# Patient Record
Sex: Female | Born: 1985 | Race: Black or African American | Hispanic: No | Marital: Single | Smoking: Never smoker
Health system: Southern US, Community
[De-identification: ages and names within clinical notes are randomized; demographics above are authoritative.]

## PROBLEM LIST (undated history)

## (undated) HISTORY — PX: NO PAST SURGERIES: SHX2092

---

## 2004-06-02 ENCOUNTER — Other Ambulatory Visit: Admission: RE | Admit: 2004-06-02 | Discharge: 2004-06-02 | Payer: Self-pay | Admitting: Obstetrics and Gynecology

## 2004-06-05 ENCOUNTER — Other Ambulatory Visit: Admission: RE | Admit: 2004-06-05 | Discharge: 2004-06-05 | Payer: Self-pay | Admitting: Obstetrics and Gynecology

## 2006-07-04 ENCOUNTER — Observation Stay (HOSPITAL_COMMUNITY): Admission: EM | Admit: 2006-07-04 | Discharge: 2006-07-06 | Payer: Self-pay | Admitting: Emergency Medicine

## 2006-07-31 ENCOUNTER — Emergency Department (HOSPITAL_COMMUNITY): Admission: EM | Admit: 2006-07-31 | Discharge: 2006-07-31 | Payer: Self-pay | Admitting: Family Medicine

## 2010-03-07 ENCOUNTER — Emergency Department (HOSPITAL_COMMUNITY): Admission: EM | Admit: 2010-03-07 | Discharge: 2010-03-07 | Payer: Self-pay | Admitting: Family Medicine

## 2011-01-31 ENCOUNTER — Inpatient Hospital Stay (HOSPITAL_COMMUNITY): Admission: AD | Admit: 2011-01-31 | Payer: Self-pay | Source: Home / Self Care | Admitting: Obstetrics and Gynecology

## 2011-02-02 ENCOUNTER — Inpatient Hospital Stay (HOSPITAL_COMMUNITY)
Admission: AD | Admit: 2011-02-02 | Discharge: 2011-02-04 | DRG: 775 | Disposition: A | Discharge status: Home / Self Care | Payer: PRIVATE HEALTH INSURANCE | Source: Ambulatory Visit | Attending: Obstetrics and Gynecology | Admitting: Obstetrics and Gynecology

## 2011-02-02 LAB — CBC
HCT: 33.9 % — ABNORMAL LOW (ref 36.0–46.0)
Hemoglobin: 10.8 g/dL — ABNORMAL LOW (ref 12.0–15.0)
MCH: 27.3 pg (ref 26.0–34.0)
MCHC: 31.9 g/dL (ref 30.0–36.0)
MCV: 85.6 fL (ref 78.0–100.0)
RDW: 12.5 % (ref 11.5–15.5)

## 2011-03-30 NOTE — H&P (Signed)
[name], [name]                ACCOUNT NO.:  [id number]   MEDICAL RECORD NO.:  [id number]          PATIENT TYPE:  EMS   LOCATION:  ED                           FACILITY:  [facility]   PHYSICIAN:  Hillery Aldo, M.D.   DATE OF BIRTH:  01/13/1986   DATE OF ADMISSION:  07/04/2006  DATE OF DISCHARGE:                                HISTORY & PHYSICAL   PRIMARY CARE PHYSICIAN:  Unassigned.   CHIEF COMPLAINT:  Sore throat.   HISTORY OF PRESENT ILLNESS:  The patient is a 25 year old female who was  sent to [facility] emergency department from [facility] secondary to  concerns that the patient might have a peritonsillar abscess.  The patient  complains of a 3-day history of worsening sore throat.  Denies any fever.  She was given IV fluids in the emergency department and was going to be  discharged home when she developed nausea and vomiting as well as watery  diarrhea.  Because of concerns of dehydration, she is being admitted for 23-  hour observation.   PAST MEDICAL HISTORY:  None.   FAMILY HISTORY:  Noncontributory.   SOCIAL HISTORY:  The patient is single.  She lives with her mother.  She is  a nonsmoker.  No alcohol or drug use.   ALLERGIES:  None.   MEDICATIONS:  None.   REVIEW OF SYSTEMS:  The patient has had a diminished appetite secondary to  throat pain.  Denies any shortness of breath or cough.  No melena or  hematochezia.  No abdominal cramping.  Prior to this illness, she had no  decrease in her energy level.  No skin rashes.  No joint pains.  No dysuria.  The patient's mother had a sore throat approximately 2 weeks ago, but no  other sick contacts.  The patient states that her menses are regular and  that she was HIV tested approximately 2 years ago.  She has had some  malodorous vaginal discharge.   PHYSICAL EXAMINATION:  VITAL SIGNS:  Temperature 100, pulse 124,  respirations 20, blood pressure 85/51, O2 saturation 97% on room air.  GENERAL:  Lethargic,  ill-appearing female who appears to be her stated age.  HEENT:  Normocephalic, atraumatic.  Pupils are equal, round and reactive to  light.  Extraocular movements intact.  Oropharynx reveals a diffusely  swollen posterior pharynx with exudative tonsillitis.  NECK:  Supple.  There is anterior cervical lymphadenopathy.  CHEST:  Lungs clear to auscultation bilaterally with good air movement.  HEART:  Tachycardiac rate, regular rhythm.  No murmurs, rubs, gallops.  ABDOMEN:  Soft, nontender, nondistended with normoactive bowel sounds.  EXTREMITIES:  No clubbing, edema, or cyanosis.  SKIN:  Warm and dry.  No rashes.  NEUROLOGIC:  The patient is alert and oriented x3.  Cranial nerves II-XII  are grossly intact.  Nonfocal.   DATA REVIEW:  Mono screen is negative.  White blood cell count is 1.8,  hemoglobin 12.9, hematocrit 38.7, platelets 151 with an absolute neutrophil  count of 1.7.  There is lymphopenia and monocytopenia.  Sodium is 136,  potassium  4.0, chloride 103, bicarbonate 23, BUN 16, creatinine 1.2, glucose  85.   ASSESSMENT/PLAN:  1. Exudative tonsillitis:  The patient has a severe exudative tonsillitis.      No obvious evidence of a peritonsillar abscess.  Nevertheless, Dr.      Annalee Genta has been consulted and plans to see the patient in the      morning.  We will admit her for 23-hour observation, and given the      severity of her tonsillitis, empirically treat her with azithromycin.      We will check strep and throat cultures.  Given her age and associated      lymphocytopenia, I will check an human immunodeficiency virus antibody      and P23 antigen.  2. Lymphopenia/leukopenia:  The patient's differential does show marked      lymphopenia.  The differential for this is broad but common causes are      severe infection, human immunodeficiency virus infection, and systemic      lupus erythematosus.  The patient does not endorse any symptoms of      systemic lupus  erythematosus.  This is likely secondary to her severe      illness.  We will check an human immunodeficiency virus antibody and      P23 antigen as outlined under problem #1.  Will monitor her blood      counts closely.  3. Dehydration:  The patient has an elevated BUN to creatinine ratio      suggestive of dehydration.  We will give her IV fluids and monitor her      renal function.  Will treat her nausea with antiemetics.  4. Prophylaxis:  Will place the patient on a proton pump inhibitor;      however, she is ambulatory and does not need deep vein thrombosis      prophylaxis.      Hillery Aldo, M.D.  Electronically Signed     CR/MEDQ  D:  07/04/2006  T:  07/04/2006  Job:  [id number]

## 2011-03-30 NOTE — Discharge Summary (Signed)
[name], [name]                ACCOUNT NO.:  [id number]   MEDICAL RECORD NO.:  [id number]          PATIENT TYPE:  INP   LOCATION:  1332                         FACILITY:  [facility]   PHYSICIAN:  Isidor Holts, M.D.  DATE OF BIRTH:  April 19, 1986   DATE OF ADMISSION:  07/03/2006  DATE OF DISCHARGE:  07/06/2006                                 DISCHARGE SUMMARY   DISCHARGE DIAGNOSIS:  1. Acute exudative tonsillitis.  2. Acute gastroenteritis.  3. Dehydration.   MEDICATIONS:  1. Azithromycin 250 mg p.o. daily for 5 days.  2. Tylenol Extra Strength 1 g p.o. p.r.n. q. 6 h. for five days only.   PROCEDURES:  None.   CONSULTATIONS:  None.   HISTORY OF PRESENT ILLNESS:  As in H&P notes of July 04, 2006, dictated by  Dr. Hillery Aldo. However, in brief this is a 25 year old female, with no  significant past medical history, who presents from [facility], with a 3 day  history of worsening sore throat, associated with vomiting and diarrhea on  the day of presentation. Physical examination revealed severe exudative  tonsillitis.  She was found to have a blood pressure of 85/51 with a pulse  rate of 124.  Electrolytes revealed a BUN of 16 with a creatinine of 1.2.  The patient was admitted for further evaluation, investigation and  management.   HOSPITAL COURSE:  PROBLEM #1 - ACUTE EXUDATIVE TONSILLITIS:  Initial concern  was of possible tonsillar abscess, however, this was not borne out by  physical examination.  The patient had a number of tests, including monospot  test, rapid strep test and HIV test, all of which were negative.  She was  treated with intravenous azithromycin with satisfactory clinical response.  By July 05, 2006, the patient was already eating quite adequately and by  July 06, 2006, was noted to be asymptomatic, inflammatory phenomena in the  tonsils had markedly subsided, and the patient is in no discomfort  whatsoever.   PROBLEM #2 - ACUTE GASTROENTERITIS:   The patient presented with a one day  history of vomiting and diarrhea of about 6 stools per day.  Stool tests  revealed no evidence of C. difficile toxin.  She was managed with  intravenous fluids, anti-emetics, proton pump inhibitor and p.r.n.  loperamide for symptomatic relief with prompt amelioration of symptoms.  By  July 05, 2006, the diarrhea had resolved, and there was no more vomiting.  By July 06, 2006, the patient was totally asymptomatic.   PROBLEM #3 - DEHYDRATION:  This occurred against a background of poor oral  intake, as well as gastroenteritis.  This responded to intravenous fluids,  and had resolved by July 05, 2006.   DISPOSITION:  The patient was considered sufficiently recovered and  clinicallly stable to be discharged on July 06, 2006.  It is pertinent to  note that at the time of initial presentation the patient's CBC showed a WBC  of 1.8, hemoglobin of 12.9, hematocrit 38.4, platelets 151,000.  ANC was  1.7, raising the specter of possible neutropenia.  However, repeat CBC later  the  same day revealed a WBC of 19.1.  By July 06, 2006, the WBC had  normalized at 9.6. It appears that initial WBC findings were likely a  laboratory error.  The patient was reassured accordingly.   DIET:  No restrictions.   ACTIVITY:  As tolerated.   WOUND CARE:  Not applicable.   PAIN MANAGEMENT:  Not applicable.   FOLLOWUP INSTRUCTIONS:  The patient has been supplied with telephone number  for [facility].  She has been strongly urged to call, to  establish a primary M.D. for routine and preventative care.  She is  recommended to return to regular duties on July 08, 2006.      Isidor Holts, M.D.  Electronically Signed     CO/MEDQ  D:  07/06/2006  T:  07/06/2006  Job:  [id number]

## 2017-03-26 ENCOUNTER — Other Ambulatory Visit (HOSPITAL_COMMUNITY): Payer: Self-pay | Admitting: Internal Medicine

## 2017-03-26 ENCOUNTER — Ambulatory Visit (HOSPITAL_COMMUNITY)
Admission: RE | Admit: 2017-03-26 | Discharge: 2017-03-26 | Disposition: A | Discharge status: Home / Self Care | Payer: Medicaid Other | Source: Ambulatory Visit | Attending: Internal Medicine | Admitting: Internal Medicine

## 2017-03-26 DIAGNOSIS — R0989 Other specified symptoms and signs involving the circulatory and respiratory systems: Secondary | ICD-10-CM | POA: Diagnosis present

## 2017-06-03 ENCOUNTER — Encounter (HOSPITAL_COMMUNITY): Payer: Self-pay | Admitting: *Deleted

## 2017-06-03 ENCOUNTER — Ambulatory Visit (HOSPITAL_COMMUNITY)
Admission: EM | Admit: 2017-06-03 | Discharge: 2017-06-03 | Disposition: A | Discharge status: Home / Self Care | Payer: Medicaid Other | Attending: Internal Medicine | Admitting: Internal Medicine

## 2017-06-03 DIAGNOSIS — S91212A Laceration without foreign body of left great toe with damage to nail, initial encounter: Secondary | ICD-10-CM | POA: Diagnosis not present

## 2017-06-03 DIAGNOSIS — W228XXA Striking against or struck by other objects, initial encounter: Secondary | ICD-10-CM | POA: Diagnosis not present

## 2017-06-03 DIAGNOSIS — S91209A Unspecified open wound of unspecified toe(s) with damage to nail, initial encounter: Secondary | ICD-10-CM

## 2017-06-03 NOTE — Discharge Instructions (Signed)
Keep area clean with soap and water. The toe nail may fall off. If it does just leave it off and apply Neosporin and a Band-Aid. Be sure to clean it and running soapy water daily. If you keep it clean his likely that it will not become infected. If you do see signs of infection such as redness, swelling, tenderness, drainage of pus or worsening of any kind seek medical attention promptly.

## 2017-06-03 NOTE — ED Triage Notes (Signed)
Pt  Snagged her    l  2nd  Toe  On a  Rhys [employer]iniFan  Yesterday  She  Has  A  Loose  Nailbed    She  Denies   Any  Pain      No bleeding

## 2017-06-03 NOTE — ED Notes (Signed)
Patient's toe soaked in a betadine sterile water and dressed with a band aid and bacitracin ointment per providers order.

## 2017-06-03 NOTE — ED Provider Notes (Signed)
CSN: [id number]     Arrival date & time 06/03/17  1602 History   First MD Initiated Contact with Patient 06/03/17 1724     Chief Complaint  Patient presents with  . Toe Injury   (Consider location/radiation/quality/duration/timing/severity/associated sxs/prior Treatment) 31 year old female stubbed her left second toe bending her nail backwards yesterday. She is not complaining of pain. She is concerned it might get infected.      History reviewed. No pertinent past medical history. History reviewed. No pertinent surgical history. History reviewed. No pertinent family history. Social History  Substance Use Topics  . Smoking status: Not on file  . Smokeless tobacco: Not on file  . Alcohol use No   OB History    No data available     Review of Systems  Constitutional: Negative.   Musculoskeletal: Negative.   All other systems reviewed and are negative.   Allergies  Patient has no known allergies.  Home Medications   Prior to Admission medications   Not on File   Meds Ordered and Administered this Visit  Medications - No data to display  BP 132/70 (BP Location: Right Arm)   Pulse 78   Temp 98.6 F (37 C) (Oral)   Resp 18   LMP 05/02/2017 Comment: depo   medrol  SpO2 100%  No data found.   Physical Exam  Constitutional: She is oriented to person, place, and time. She appears well-developed and well-nourished. No distress.  Pulmonary/Chest: Effort normal.  Musculoskeletal: Normal range of motion. She exhibits no edema, tenderness or deformity.  Neurological: She is alert and oriented to person, place, and time.  Skin:  Toenail is loose and may come off soon but in place. No signs of infection. No discoloration. No tenderness. No swelling.   Nursing note and vitals reviewed.   Urgent Care Course     Procedures (including critical care time)  Labs Review Labs Reviewed - No data to display  Imaging Review No results found.   Visual Acuity  Review  Right Eye Distance:   Left Eye Distance:   Bilateral Distance:    Right Eye Near:   Left Eye Near:    Bilateral Near:         MDM   1. Avulsion of toenail of left foot    Keep area clean with soap and water. The toe nail may fall off. If it does just leave it off and apply Neosporin and a Band-Aid. Be sure to clean it and running soapy water daily. If you keep it clean his likely that it will not become infected. If you do see signs of infection such as redness, swelling, tenderness, drainage of pus or worsening of any kind seek medical attention promptly. Toe soap and weak Betadine water,  and Karlyne GreenspanBand-Aid    Ahman Dugdale, NP 06/03/17 1736

## 2017-09-19 IMAGING — DX DG CHEST 2V
2 series · 2 of 2 positions shown · non-contrast
Comparison: None.

CLINICAL DATA: Chest crackles on physical exam.

EXAM:
CHEST  2 VIEW

[w chest pa]
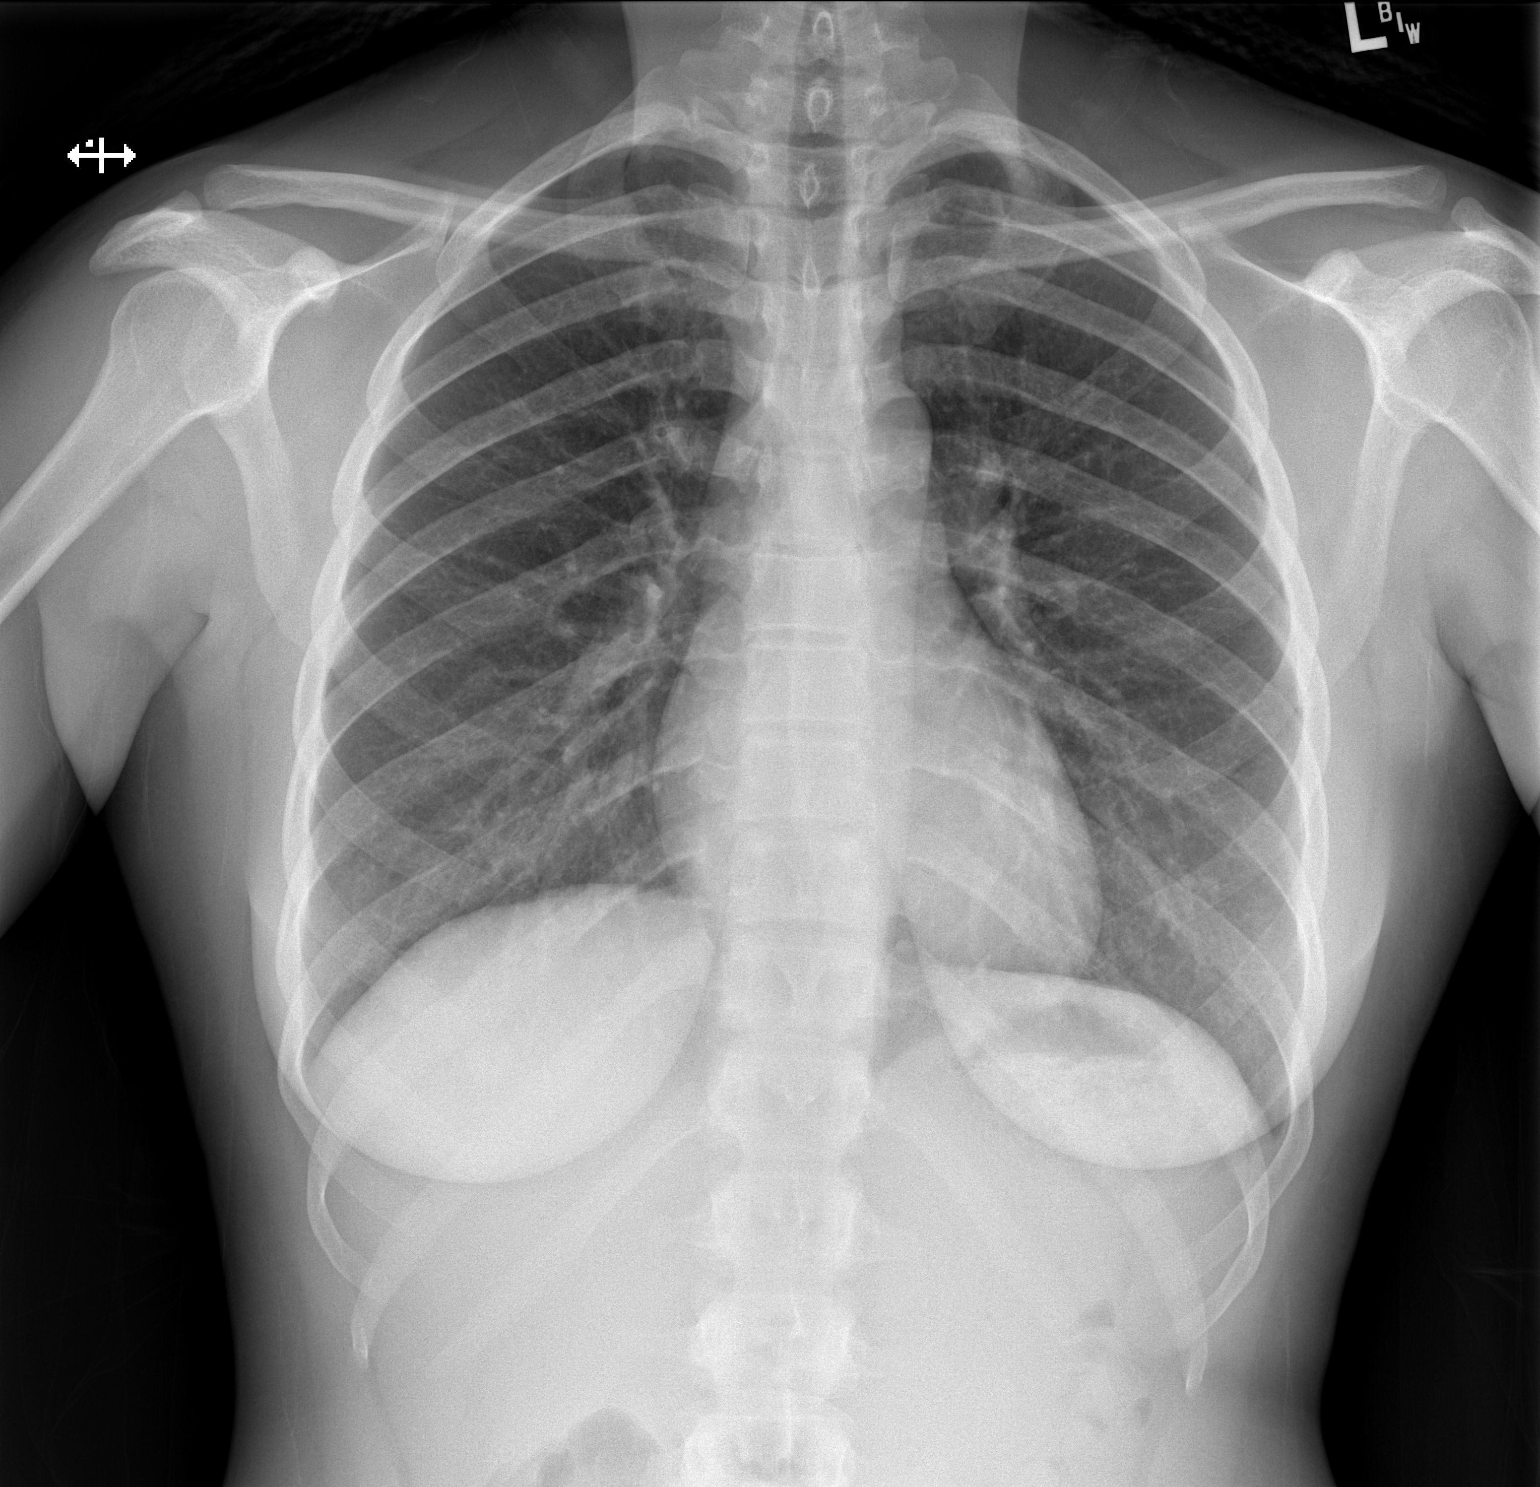

[w chest lat]
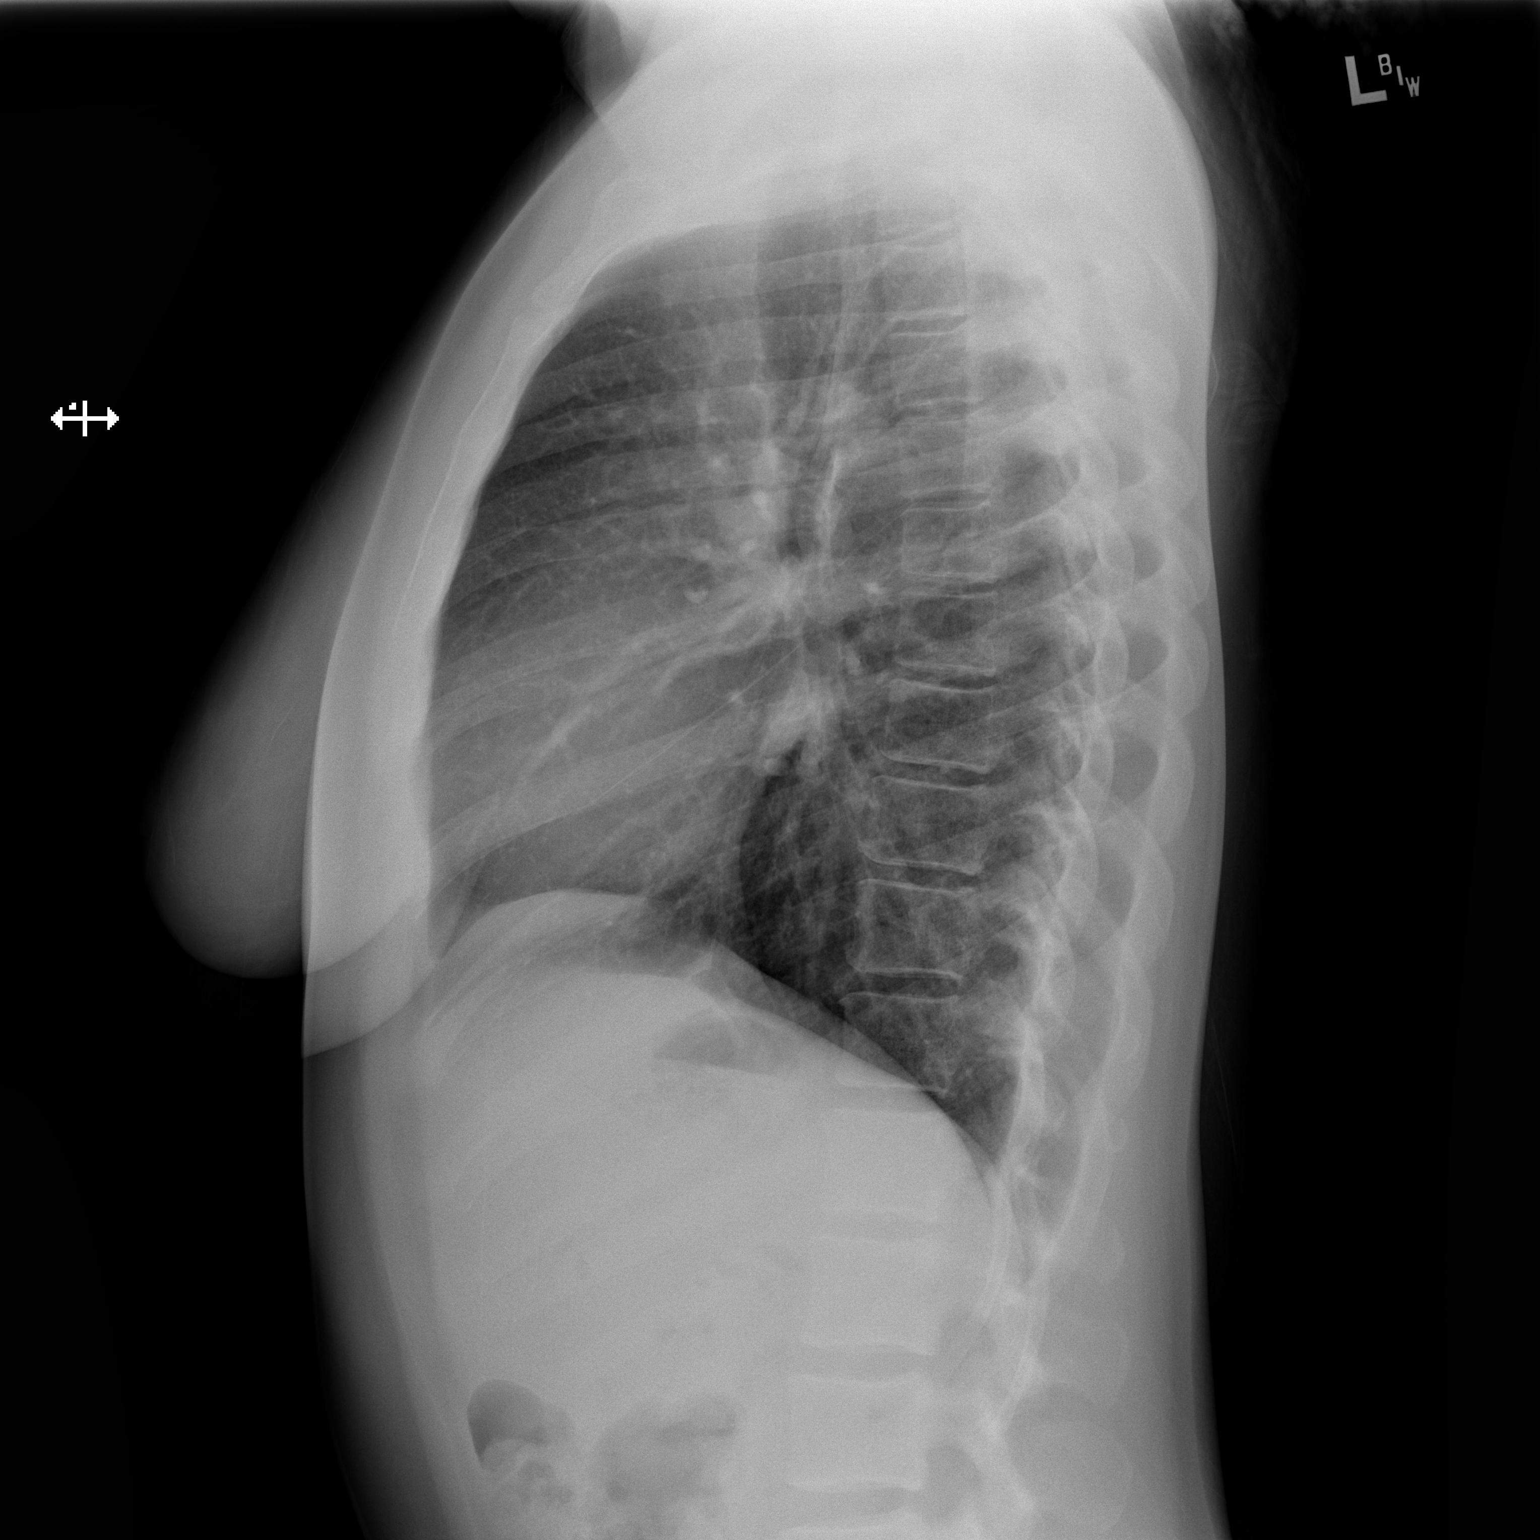

[2 of 2 positions shown; findings below may reference images not displayed]

FINDINGS: The heart size and mediastinal contours are within normal limits.
Both lungs are clear. The visualized skeletal structures are
unremarkable.
IMPRESSION: Normal chest.

## 2017-11-12 NOTE — L&D Delivery Note (Signed)
Patient: [name] MRN: [id number]  GBS status: negative  Patient is a 32 y.o. now G2P2 s/p NSVD at [redacted]w[redacted]d, who was admitted for preterm labor. SROM 0h 47m prior to delivery with clear fluid.    Delivery Note At 5:52 PM a viable female was delivered via Vaginal, Spontaneous (Presentation: cephalic; LOA).  APGAR: 9, 9; weight: pending: appears SGA.   Placenta status: intact, 3 vessel cord.  Cord: with the following complications: none. Placenta sent to pathology.  Anesthesia:   Episiotomy: None Lacerations: None Suture Repair: none Est. Blood Loss (mL):    Mom to postpartum.  Baby to Nursery.  Gwenevere Abbot 08/14/2018, 7:21 PM  Delivery was attended by Thressa Sheller, CNM    Shoulder and body delivered in usual fashion. Infant with spontaneous cry, placed in warmer, dried and bulb suctioned. Cord clamped x 2 immediately following delivery and cut by delivery provider. Cord blood drawn. Placenta delivered spontaneously with gentle cord traction. Fundus firm with massage and IM Pitocin. Perineum inspected and found to have no lacerations. Good hemostasis achieved.

## 2018-07-21 LAB — OB RESULTS CONSOLE ANTIBODY SCREEN: Antibody Screen: NEGATIVE

## 2018-07-21 LAB — OB RESULTS CONSOLE ABO/RH: RH Type: POSITIVE

## 2018-07-21 LAB — OB RESULTS CONSOLE RUBELLA ANTIBODY, IGM: RUBELLA: IMMUNE

## 2018-07-21 LAB — OB RESULTS CONSOLE RPR: RPR: NONREACTIVE

## 2018-07-21 LAB — OB RESULTS CONSOLE HIV ANTIBODY (ROUTINE TESTING): HIV: NONREACTIVE

## 2018-07-21 LAB — OB RESULTS CONSOLE GC/CHLAMYDIA: Chlamydia: POSITIVE

## 2018-07-21 LAB — OB RESULTS CONSOLE HEPATITIS B SURFACE ANTIGEN: Hepatitis B Surface Ag: NEGATIVE

## 2018-08-11 LAB — OB RESULTS CONSOLE GBS: GBS: NEGATIVE

## 2018-08-14 ENCOUNTER — Inpatient Hospital Stay (HOSPITAL_COMMUNITY)
Admission: AD | Admit: 2018-08-14 | Discharge: 2018-08-16 | DRG: 807 | Disposition: A | Discharge status: Home / Self Care | Payer: Medicaid Other | Attending: Obstetrics & Gynecology | Admitting: Obstetrics & Gynecology

## 2018-08-14 ENCOUNTER — Encounter (HOSPITAL_COMMUNITY): Payer: Self-pay | Admitting: *Deleted

## 2018-08-14 DIAGNOSIS — Z3A36 36 weeks gestation of pregnancy: Secondary | ICD-10-CM

## 2018-08-14 LAB — RAPID URINE DRUG SCREEN, HOSP PERFORMED
Amphetamines: NOT DETECTED
BARBITURATES: NOT DETECTED
Benzodiazepines: NOT DETECTED
COCAINE: NOT DETECTED
OPIATES: NOT DETECTED
TETRAHYDROCANNABINOL: NOT DETECTED

## 2018-08-14 MED ORDER — SIMETHICONE 80 MG PO CHEW: 80.0000 mg | CHEWABLE_TABLET | ORAL | Status: DC | PRN

## 2018-08-14 MED ORDER — ACETAMINOPHEN 325 MG PO TABS
650.0000 mg | ORAL_TABLET | ORAL | Status: DC | PRN
  Administered 2018-08-14: 650 mg via ORAL
  Filled 2018-08-14: qty 2

## 2018-08-14 MED ORDER — ONDANSETRON HCL 4 MG/2ML IJ SOLN: 4.0000 mg | INTRAMUSCULAR | Status: DC | PRN

## 2018-08-14 MED ORDER — SENNOSIDES-DOCUSATE SODIUM 8.6-50 MG PO TABS
2.0000 | ORAL_TABLET | ORAL | Status: DC
  Administered 2018-08-15 (×2): 2 via ORAL
  Filled 2018-08-14 (×2): qty 2

## 2018-08-14 MED ORDER — SODIUM CHLORIDE 0.9% FLUSH: 3.0000 mL | Freq: Two times a day (BID) | INTRAVENOUS | Status: DC

## 2018-08-14 MED ORDER — OXYTOCIN 10 UNIT/ML IJ SOLN
10.0000 [IU] | Freq: Once | INTRAMUSCULAR | Status: AC
  Administered 2018-08-14: 10 [IU] via INTRAMUSCULAR
  Filled 2018-08-14: qty 1

## 2018-08-14 MED ORDER — WITCH HAZEL-GLYCERIN EX PADS: 1.0000 "application " | MEDICATED_PAD | CUTANEOUS | Status: DC | PRN

## 2018-08-14 MED ORDER — MEASLES, MUMPS & RUBELLA VAC ~~LOC~~ INJ: 0.5000 mL | INJECTION | Freq: Once | SUBCUTANEOUS | Status: DC

## 2018-08-14 MED ORDER — ZOLPIDEM TARTRATE 5 MG PO TABS: 5.0000 mg | ORAL_TABLET | Freq: Every evening | ORAL | Status: DC | PRN

## 2018-08-14 MED ORDER — PRENATAL MULTIVITAMIN CH
1.0000 | ORAL_TABLET | Freq: Every day | ORAL | Status: DC
  Administered 2018-08-15 – 2018-08-16 (×2): 1 via ORAL
  Filled 2018-08-14 (×2): qty 1

## 2018-08-14 MED ORDER — TETANUS-DIPHTH-ACELL PERTUSSIS 5-2.5-18.5 LF-MCG/0.5 IM SUSP: 0.5000 mL | Freq: Once | INTRAMUSCULAR | Status: DC

## 2018-08-14 MED ORDER — DIPHENHYDRAMINE HCL 25 MG PO CAPS: 25.0000 mg | ORAL_CAPSULE | Freq: Four times a day (QID) | ORAL | Status: DC | PRN

## 2018-08-14 MED ORDER — BETAMETHASONE SOD PHOS & ACET 6 (3-3) MG/ML IJ SUSP
12.0000 mg | Freq: Once | INTRAMUSCULAR | Status: DC
  Filled 2018-08-14: qty 2

## 2018-08-14 MED ORDER — SODIUM CHLORIDE 0.9% FLUSH: 3.0000 mL | INTRAVENOUS | Status: DC | PRN

## 2018-08-14 MED ORDER — ONDANSETRON HCL 4 MG PO TABS: 4.0000 mg | ORAL_TABLET | ORAL | Status: DC | PRN

## 2018-08-14 MED ORDER — DIBUCAINE 1 % RE OINT: 1.0000 "application " | TOPICAL_OINTMENT | RECTAL | Status: DC | PRN

## 2018-08-14 MED ORDER — IBUPROFEN 600 MG PO TABS
600.0000 mg | ORAL_TABLET | Freq: Four times a day (QID) | ORAL | Status: DC
  Administered 2018-08-15 – 2018-08-16 (×7): 600 mg via ORAL
  Filled 2018-08-14 (×7): qty 1

## 2018-08-14 MED ORDER — COCONUT OIL OIL: 1.0000 "application " | TOPICAL_OIL | Status: DC | PRN

## 2018-08-14 MED ORDER — BENZOCAINE-MENTHOL 20-0.5 % EX AERO: 1.0000 "application " | INHALATION_SPRAY | CUTANEOUS | Status: DC | PRN

## 2018-08-14 MED ORDER — SODIUM CHLORIDE 0.9 % IV SOLN: 250.0000 mL | INTRAVENOUS | Status: DC | PRN

## 2018-08-14 NOTE — Lactation Note (Addendum)
This note was copied from a baby's chart. Lactation Consultation Note  Patient Name: [name] BJYNW'G Date: 08/14/2018 Reason for consult: Initial assessment;Late-preterm 34-36.6wks P2, LPTI, 4 hrs female and infant Per mom, active on [facility] program in pregnancy. Doesn't have breast pump at home. Per mom, she breastfeed her eldest daughter for one year she is now 32 years old. Mom latched infant on  her right breast using the cross-cradle hold,  with deep depth and swallows observed by [facility] and Nurse. Infant BF for 8 minutes.  LC  asked  mom demostrated  hand expression, mom hand expressed 5 ml of colostrum that was spoon feed to infant. Mom shown how to use DEBP & how to disassemble, clean, & reassemble parts. Mom is doing STS after BF. LC discussed the importance of STS with mom. LC discussed infant feeding behaviors and feeding guidelines for LPTI. Mom will BF according to  hunger cues, 8 to 12 times within 24 hours. Reviewed Baby & Me book's Breastfeeding Basics.  Mom made aware of O/P services, breastfeeding support groups, community resources, and our phone # for post-discharge questions.  Mom's plan: 1. Mom will do STS 2. BF according hunger cues, 8 to 12 times 24 hours and after BF mom will give EBM to infant  according feeding guidelines for LPTI  hrs/ age (0-24 hours old). 3. Mom will pump every 3 hours.  Maternal Data Formula Feeding for Exclusion: No Has patient been taught Hand Expression?: Yes(Mom hand expressed 5 ml of colostrum that was spoon feed to infant.) Does the patient have breastfeeding experience prior to this delivery?: Yes  Feeding Feeding Type: Breast Fed  LATCH Score Latch: Grasps breast easily, tongue down, lips flanged, rhythmical sucking.  Audible Swallowing: Spontaneous and intermittent  Type of Nipple: Everted at rest and after stimulation  Comfort (Breast/Nipple): Soft / non-tender  Hold (Positioning): Assistance needed to correctly position  infant at breast and maintain latch.  LATCH Score: 9  Interventions Interventions: Breast feeding basics reviewed;Assisted with latch;Skin to skin;DEBP  Lactation Tools Discussed/Used WIC Program: Yes Pump Review: Setup, frequency, and cleaning;Milk Storage Initiated by:: Danelle Earthly, IBCLC Date initiated:: 08/14/18   Consult Status Consult Status: Complete    Danelle Earthly 08/14/2018, 10:23 PM

## 2018-08-14 NOTE — H&P (Signed)
OBSTETRIC ADMISSION HISTORY AND PHYSICAL  RHAYNE CHATWIN is a 32 y.o. female [phone] with IUP at [redacted]w[redacted]d by LMP presenting for preterm labor.  Patient was brought in by EMS in active labor. On first check in the MAU, she was crowning, and delivered precipitously. She states that contractions started last night some time.   Reports fetal movement. Denies vaginal bleeding or leakage of fluid.  She received her prenatal care at [facility]   Ultrasounds . Anatomy U/S: normal at 33 weeks  Prenatal History/Complications: . Late to care and poor prenatal care . Chlamydia treated without TOC  Past Medical History: No past medical history on file.  Past Surgical History: No past surgical history on file.  Obstetrical History: OB History    Gravida  2   Para  2   Term  1   Preterm  1   AB      Living  2     SAB      TAB      Ectopic      Multiple  0   Live Births  2           Social History: Social History   Socioeconomic History  . Marital status: Single    Spouse name: Not on file  . Number of children: Not on file  . Years of education: Not on file  . Highest education level: Not on file  Occupational History  . Not on file  Social Needs  . Financial resource strain: Not on file  . Food insecurity:    Worry: Not on file    Inability: Not on file  . Transportation needs:    Medical: Not on file    Non-medical: Not on file  Tobacco Use  . Smoking status: Not on file  Substance and Sexual Activity  . Alcohol use: No  . Drug use: Not on file  . Sexual activity: Never  Lifestyle  . Physical activity:    Days per week: Not on file    Minutes per session: Not on file  . Stress: Not on file  Relationships  . Social connections:    Talks on phone: Not on file    Gets together: Not on file    Attends religious service: Not on file    Active member of club or organization: Not on file    Attends meetings of clubs or organizations: Not on file     Relationship status: Not on file  Other Topics Concern  . Not on file  Social History Narrative  . Not on file    Family History: No family history on file.  Allergies: No Known Allergies  No medications prior to admission.     Review of Systems  All systems reviewed and negative except as stated in HPI  Blood pressure 117/68, pulse 74, unknown if currently breastfeeding. General appearance: alert, appears stated age and severe distress Lungs: no respiratory distress Heart: regular rate  Abdomen: soft, non-tender; gravid b Pelvic: complete/crowning Extremities: Homans sign is negative, no sign of DVT Presentation: cephalic Fetal monitoring: 130s Uterine activity: regular contractions    Prenatal labs: ABO, Rh: A/Positive/-- (09/09 0000) Antibody: Negative (09/09 0000) Rubella: Immune (09/09 0000) RPR: Nonreactive (09/09 0000)  HBsAg: Negative (09/09 0000)  HIV: Non-reactive (09/09 0000)  GBS: Negative (09/30 0000)  Glucola: 101 Genetic screening:  NA  Prenatal Transfer Tool  Maternal Diabetes: No Genetic Screening: Declined Maternal Ultrasounds/Referrals: Normal Fetal Ultrasounds or other Referrals:  None Maternal Substance Abuse:  No Significant Maternal Medications:  None Significant Maternal Lab Results: None  No results found for this or any previous visit (from the past 24 hour(s)).  Patient Active Problem List   Diagnosis Date Noted  . Preterm labor 08/14/2018  . Spontaneous vaginal delivery 08/14/2018    Assessment/Plan:  QUIERA DIFFEE is a 32 y.o. G2P1102 at [redacted]w[redacted]d here for spontaneous preterm vaginal delivery  Labor: delivered -- pain control: none  Fetal Wellbeing:  -- GBS (neg) -- continuous fetal monitoring - delivered   Postpartum Planning -- breast -- Rh (+)/Rubella immune/screen for Tdap   Gwenevere Abbot, MD OB/GYN Fellow

## 2018-08-14 NOTE — MAU Note (Signed)
P=NSVD of viable female at 36.3 weks

## 2018-08-14 NOTE — MAU Note (Signed)
EMS arrival pt has urge to push. Crowning noted.

## 2018-08-15 ENCOUNTER — Encounter (HOSPITAL_COMMUNITY)
Admission: AD | Disposition: A | Discharge status: Home / Self Care | Payer: Self-pay | Source: Home / Self Care | Attending: Obstetrics & Gynecology

## 2018-08-15 LAB — CBC
HEMATOCRIT: 30.2 % — AB (ref 36.0–46.0)
Hemoglobin: 10.1 g/dL — ABNORMAL LOW (ref 12.0–15.0)
MCH: 27.7 pg (ref 26.0–34.0)
MCHC: 33.4 g/dL (ref 30.0–36.0)
MCV: 83 fL (ref 78.0–100.0)
Platelets: 216 10*3/uL (ref 150–400)
RBC: 3.64 MIL/uL — ABNORMAL LOW (ref 3.87–5.11)
RDW: 13 % (ref 11.5–15.5)
WBC: 12.8 10*3/uL — AB (ref 4.0–10.5)

## 2018-08-15 SURGERY — LIGATION, FALLOPIAN TUBE, POSTPARTUM
Anesthesia: Regional | Laterality: Bilateral

## 2018-08-15 MED ORDER — LACTATED RINGERS IV SOLN: INTRAVENOUS | Status: DC

## 2018-08-15 NOTE — Progress Notes (Signed)
Dr Annia Friendly is notified of need to have GC/Chlamydia swab collect by MD. Dr Annia Friendly said she would make arrangement for the swab.

## 2018-08-15 NOTE — Progress Notes (Signed)
Post Partum Day 1 Subjective: no complaints, up ad lib, voiding, tolerating PO and + flatus  Objective: Blood pressure 99/60, pulse 79, temperature 97.9 F (36.6 C), temperature source Oral, resp. rate 18, unknown if currently breastfeeding.  Physical Exam:  General: alert and no distress Lochia: appropriate Uterine Fundus: firm DVT Evaluation: No evidence of DVT seen on physical exam. Negative Homan's sign. No cords or calf tenderness.  Recent Labs    08/15/18 0635  HGB 10.1*  HCT 30.2*    Assessment/Plan: Breastfeeding  Patient initially desired permanent sterilization and was scheduled for BTL this afternoon.  Risks of procedure discussed with patient including but not limited to: risk of regret, permanence of method, bleeding, infection, injury to surrounding organs and need for additional procedures.  Failure risk of about 1% with increased risk of ectopic gestation if pregnancy occurs was also discussed with patient.  Other reversible forms of contraception were discussed with patient; she desires IUD in lieu of surgery.  OR was notified, case cancelled. Will continue routine postpartum care. Plan for discharge tomorrow.   LOS: 1 day   Jenna Collins, MD 08/15/2018, 10:25 AM

## 2018-08-15 NOTE — Progress Notes (Signed)
Due to high hospital census and acuity, CSW is unable to meet with MOB to complete assessment for Late PNC.  CSW notes that baby's UDS is negative and will monitor CDS result.  CSW will make report to Child Protective Services if warranted.  Please consult CSW if concerns arise or by MOB's request.  Kamaryn Grimley Boyd-Gilyard, MSW, LCSW Clinical Social Work [phone]  

## 2018-08-15 NOTE — Progress Notes (Signed)
Dr Annia Friendly changes GC/Chlamydia to urine collect. Pt is been instructed on clean catch urine. She states understanding info.

## 2018-08-16 LAB — RPR: RPR: NONREACTIVE

## 2018-08-16 MED ORDER — WITCH HAZEL-GLYCERIN EX PADS: 1.0000 "application " | MEDICATED_PAD | CUTANEOUS | 12 refills | Status: DC | PRN

## 2018-08-16 MED ORDER — IBUPROFEN 600 MG PO TABS: 600.0000 mg | ORAL_TABLET | Freq: Four times a day (QID) | ORAL | 0 refills | Status: DC

## 2018-08-16 MED ORDER — ACETAMINOPHEN 325 MG PO TABS: 650.0000 mg | ORAL_TABLET | ORAL | 0 refills | Status: DC | PRN

## 2018-08-16 NOTE — Discharge Summary (Signed)
Postpartum Discharge Summary     Patient Name: [name] DOB: November 06, 1986 MRN: [id number]  Date of admission: 08/14/2018 Delivering Provider: Gwenevere Abbot   Date of discharge: 08/16/2018  Admitting diagnosis: EMS Intrauterine pregnancy: [redacted]w[redacted]d     Secondary diagnosis:  Active Problems:   Preterm labor   Spontaneous vaginal delivery   SVD (spontaneous vaginal delivery)  Additional problems: Social work consult for late prenatal care     Discharge diagnosis: Preterm Pregnancy Delivered                                                                                                Post partum procedures:N/A  Augmentation: N/A  Complications: None  Hospital course:  Onset of Labor With Vaginal Delivery     32 y.o. yo [id number] at [redacted]w[redacted]d was admitted in Active Labor on 08/14/2018. Patient had an uncomplicated labor course as follows:  Membrane Rupture Time/Date: 5:48 PM ,08/14/2018   Intrapartum Procedures: Episiotomy: None [1]                                         Lacerations:  None [1]  Patient had a delivery of a Viable infant. 08/14/2018  Information for the patient's newborn:  Sakeena, Teall [[id number]]  Delivery Method: Vaginal, Spontaneous(Filed from Delivery Summary)    Pateint had an uncomplicated postpartum course.  She is ambulating, tolerating a regular diet, passing flatus, and urinating well. Patient is discharged home in stable condition on 08/16/18.   Magnesium Sulfate recieved: No BMZ received: No  Physical exam  Vitals:   08/15/18 1245 08/15/18 1506 08/15/18 2324 08/16/18 0637  BP: 111/64 107/71 (!) 99/56 103/63  Pulse: 93 86 79 70  Resp:  17 17 18   Temp: 98.4 F (36.9 C) 98.4 F (36.9 C) 98.5 F (36.9 C) 98.5 F (36.9 C)  TempSrc: Oral Oral Oral Oral  SpO2:  100%     General: alert, cooperative and no distress Lochia: appropriate Uterine Fundus: firm Incision: N/A DVT Evaluation: No evidence of DVT seen on physical exam. Labs: Lab  Results  Component Value Date   WBC 12.8 (H) 08/15/2018   HGB 10.1 (L) 08/15/2018   HCT 30.2 (L) 08/15/2018   MCV 83.0 08/15/2018   PLT 216 08/15/2018   No flowsheet data found.  Discharge instruction: per After Visit Summary and "Baby and Me Booklet".  After visit meds:  Allergies as of 08/16/2018   No Known Allergies     Medication List    STOP taking these medications   prenatal multivitamin Tabs tablet     TAKE these medications   acetaminophen 325 MG tablet Commonly known as:  TYLENOL Take 2 tablets (650 mg total) by mouth every 4 (four) hours as needed for mild pain or moderate pain (for pain scale < 4).   ibuprofen 600 MG tablet Commonly known as:  ADVIL,MOTRIN Take 1 tablet (600 mg total) by mouth every 6 (six) hours.   witch hazel-glycerin pad Commonly known as:  TUCKS Apply 1 application topically as needed for hemorrhoids.       Diet: routine diet  Activity: Advance as tolerated. Pelvic rest for 6 weeks.   Outpatient follow up:4 weeks Follow up Appt:No future appointments. Follow up Visit:No follow-ups on file.   Please schedule this patient for Postpartum visit in: 4 weeks with the following provider: Any provider For C/S patients schedule nurse incision check in weeks 2 weeks: no Low risk pregnancy complicated by: late [facility] Delivery mode:  SVD Anticipated Birth Control:  IUD PP Procedures needed: IUD placement  Schedule Integrated BH visit: yes      Newborn Data: Live born female  Birth Weight: 5 lb 0.3 oz (2275 g) APGAR: 9, 9  Newborn Delivery   Birth date/time:  08/14/2018 17:52:00 Delivery type:  Vaginal, Spontaneous     Baby Feeding: Breast Disposition:home with mother   08/16/2018 Calvert Cantor, CNM

## 2018-08-16 NOTE — Lactation Note (Signed)
This note was copied from a baby's chart. Lactation Consultation Note  Patient Name: [name] [id number] Date: 08/16/2018   P2, Ex BF.  Baby [redacted]w[redacted]d.  < 6 lbs.   Mother recently bf baby for 20 min and relatched baby during consult. Intermittent swallows observed.  Mother is pumping and receiving approx 25 ml. Provided mother w/ manual pump and demonstrated how to convert DEBP to double manual. Gave mother paperwork for [facility] loaner and slow flow Nfant purple nipples and green nipples for her to try. Mom encouraged to feed baby 8-12 times/24 hours and with feeding cues at least q 3 hours.  Encouraged mother to continue to post pump and give baby back extra volume of breastmilk. Reviewed engorgement care and monitoring voids/stools.        Maternal Data    Feeding Feeding Type: Breast Fed  LATCH Score                   Interventions    Lactation Tools Discussed/Used Tools: Pump Breast pump type: Double-Electric Breast Pump   Consult Status      Hardie Pulley 08/16/2018, 9:17 AM

## 2018-08-16 NOTE — Discharge Instructions (Signed)

## 2018-08-16 NOTE — Progress Notes (Signed)
Spoke with Alfreda in lab about result for GC/chlamydia, stated it is sent to [facility]. Called [facility] main lab and no one to run test until Sunday night, asked if specimen received and stated not in "bucket". Asked Doloris Hall RN caring for patient to recollect.

## 2018-08-16 NOTE — Progress Notes (Signed)
Urine specimen for GC/chlamydia collected at 1135 and sent to lab.

## 2018-08-16 NOTE — Lactation Note (Signed)
This note was copied from a baby's chart. Lactation Consultation Note Baby 31 hrs old. Wt. 4.15 lbs. LPI has been cluster feeding per mom.  Reviewed LPI information sheet again. Reminded mom not to feed longer than 30 min. Total. Asked mom if she had been mentioned about supplementing, mom stated yes. Then asked mom had she been supplementing, mom stated just a little colostrum, not much. Reviewed amount needed according to hours of age. At 60 hrs old age baby needs 10-20 ml of supplement after BF.  Mom stated she was pumping every 3 hrs. Mom had a little colostrum in bottles. Approx. 2 ml.reviewed milk storage, hand expressing, breast massage, and supplementing. Praised mom for having colostrum. LC hand expressed 10 ml colostrum. Encouraged mom to pump, then hand express after pumping, combine colostrum, give to baby. Mom stated she is going to breast and bottle BM. Slow flow nipples given to mom to give colostrum after pumping. Discussed to increase amount of supplement to equal amount needed, mom is in agreement to give formula. Discussed baby will need 22 cal. Similac. Not to mix w/colostrum. Discussed mom may not need formula if she is able to give amount of BM needed. Baby needs the higher amount of supplement since low wt. Asked mom to call RN for formula if needed. Mom was doing STS w/baby after feeding. Praised mom.  Patient Name: [name] [id number] Date: 08/16/2018 Reason for consult: Follow-up assessment;Late-preterm 34-36.6wks;Infant < 6lbs   Maternal Data    Feeding Feeding Type: Breast Fed  LATCH Score Latch: Grasps breast easily, tongue down, lips flanged, rhythmical sucking.  Audible Swallowing: A few with stimulation  Type of Nipple: Everted at rest and after stimulation  Comfort (Breast/Nipple): Soft / non-tender  Hold (Positioning): No assistance needed to correctly position infant at breast.  LATCH Score: 9  Interventions Interventions: DEBP;Support  pillows;Skin to skin;Breast massage;Expressed milk;Hand express;Breast compression  Lactation Tools Discussed/Used     Consult Status Consult Status: Follow-up Date: 08/17/18 Follow-up type: In-patient    [name], Diamond Nickel 08/16/2018, 1:49 AM

## 2018-08-17 ENCOUNTER — Ambulatory Visit: Payer: Self-pay

## 2018-08-17 NOTE — Lactation Note (Signed)
This note was copied from a baby's chart. Lactation Consultation Note  Patient Name: Girl Takima Encina [id number] Date: 08/17/2018    Baby 62 hours old.  [redacted]w[redacted]d.  Ex BF. Weight increased. 1.5% weight loss.  9 voids/6 stools in the last 24 hours. Per mother baby does not like bottle feeding so she is primarily breastfeeding.   Praised mother for her efforts.  The last time she pumped she received between 25-30 ml. Provided mother with foley cup as an alternative to the bottle if needed. Mother has manual pump to take home. Mom encouraged to feed baby 8-12 times/24 hours and with feeding cues at least q 3 hours and continue what she is doing.     Maternal Data    Feeding Feeding Type: Breast Fed  LATCH Score Latch: Grasps breast easily, tongue down, lips flanged, rhythmical sucking.  Audible Swallowing: A few with stimulation  Type of Nipple: Everted at rest and after stimulation  Comfort (Breast/Nipple): Soft / non-tender  Hold (Positioning): No assistance needed to correctly position infant at breast.  LATCH Score: 9  Interventions    Lactation Tools Discussed/Used     Consult Status      Dahlia Byes [facility] 08/17/2018, 8:45 AM

## 2020-02-11 ENCOUNTER — Ambulatory Visit: Payer: Medicaid Other | Attending: Internal Medicine

## 2020-02-11 DIAGNOSIS — Z23 Encounter for immunization: Secondary | ICD-10-CM

## 2020-02-11 NOTE — Progress Notes (Signed)
   Covid-19 Vaccination Clinic  Name:  [name]    MRN: [id number] DOB: 1986-02-05  02/11/2020  [name] was observed post Covid-19 immunization for 15 minutes without incident. She was provided with Vaccine Information Sheet and instruction to access the V-Safe system.   [name] was instructed to call 911 with any severe reactions post vaccine: Marland Kitchen Difficulty breathing  . Swelling of face and throat  . A fast heartbeat  . A bad rash all over body  . Dizziness and weakness   Immunizations Administered    Name Date Dose VIS Date Route   Pfizer COVID-19 Vaccine 02/11/2020 12:14 PM 0.3 mL 10/23/2019 Intramuscular   Manufacturer: [employer], [employer]   Lot: [id number]   NDC: [address]-4

## 2020-03-07 ENCOUNTER — Ambulatory Visit: Payer: Medicaid Other | Attending: Internal Medicine

## 2020-03-07 DIAGNOSIS — Z23 Encounter for immunization: Secondary | ICD-10-CM

## 2020-03-07 NOTE — Progress Notes (Signed)
   Covid-19 Vaccination Clinic  Name:  ELYANAH FARINO    MRN: [id number] DOB: 29-Nov-1985  03/07/2020  Ms. Burgener was observed post Covid-19 immunization for 15 minutes without incident. She was provided with Vaccine Information Sheet and instruction to access the V-Safe system.   Ms. Heinrichs was instructed to call 911 with any severe reactions post vaccine: Marland Kitchen Difficulty breathing  . Swelling of face and throat  . A fast heartbeat  . A bad rash all over body  . Dizziness and weakness   Immunizations Administered    Name Date Dose VIS Date Route   Pfizer COVID-19 Vaccine 03/07/2020  4:42 PM 0.3 mL 01/06/2019 Intramuscular   Manufacturer: [employer], [employer]   Lot: [id number]   NDC: [address]-4

## 2020-11-22 ENCOUNTER — Ambulatory Visit: Payer: Medicaid Other | Attending: Internal Medicine

## 2020-11-22 DIAGNOSIS — Z23 Encounter for immunization: Secondary | ICD-10-CM

## 2020-11-22 NOTE — Progress Notes (Signed)
   Covid-19 Vaccination Clinic  Name:  [name]    MRN: [id number] DOB: 10/27/1986  11/22/2020  [name] was observed post Covid-19 immunization for 15 minutes without incident. [name] was provided with Vaccine Information Sheet and instruction to access the V-Safe system.   [name] was instructed to call 911 with any severe reactions post vaccine: Marland Kitchen Difficulty breathing  . Swelling of face and throat  . A fast heartbeat  . A bad rash all over body  . Dizziness and weakness   Immunizations Administered    Name Date Dose VIS Date Route   Pfizer COVID-19 Vaccine 11/22/2020  4:18 PM 0.3 mL 08/31/2020 Intramuscular   Manufacturer: [employer], [employer]   Lot: [id number]   NDC: [address]-3

## 2021-02-13 ENCOUNTER — Other Ambulatory Visit: Payer: Self-pay

## 2021-02-13 ENCOUNTER — Encounter: Payer: Self-pay | Admitting: *Deleted

## 2021-02-13 ENCOUNTER — Ambulatory Visit
Admission: EM | Admit: 2021-02-13 | Discharge: 2021-02-13 | Disposition: A | Discharge status: Home / Self Care | Payer: Medicaid Other | Attending: Emergency Medicine | Admitting: Emergency Medicine

## 2021-02-13 DIAGNOSIS — S161XXA Strain of muscle, fascia and tendon at neck level, initial encounter: Secondary | ICD-10-CM

## 2021-02-13 DIAGNOSIS — M62838 Other muscle spasm: Secondary | ICD-10-CM | POA: Diagnosis not present

## 2021-02-13 MED ORDER — IBUPROFEN 600 MG PO TABS: 600.0000 mg | ORAL_TABLET | Freq: Four times a day (QID) | ORAL | 0 refills | Status: DC | PRN

## 2021-02-13 MED ORDER — TIZANIDINE HCL 4 MG PO TABS: 4.0000 mg | ORAL_TABLET | Freq: Three times a day (TID) | ORAL | 0 refills | Status: DC | PRN
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
## 2021-04-23 ENCOUNTER — Other Ambulatory Visit: Payer: Self-pay

## 2021-04-23 ENCOUNTER — Emergency Department (HOSPITAL_COMMUNITY)
Admission: EM | Admit: 2021-04-23 | Discharge: 2021-04-23 | Disposition: A | Discharge status: Home / Self Care | Payer: Medicaid Other | Attending: Emergency Medicine | Admitting: Emergency Medicine

## 2021-04-23 ENCOUNTER — Encounter (HOSPITAL_COMMUNITY): Payer: Self-pay | Admitting: Emergency Medicine

## 2021-04-23 DIAGNOSIS — Z202 Contact with and (suspected) exposure to infections with a predominantly sexual mode of transmission: Secondary | ICD-10-CM | POA: Insufficient documentation

## 2021-04-23 DIAGNOSIS — A599 Trichomoniasis, unspecified: Secondary | ICD-10-CM | POA: Diagnosis not present

## 2021-04-23 DIAGNOSIS — R1032 Left lower quadrant pain: Secondary | ICD-10-CM | POA: Insufficient documentation

## 2021-04-23 LAB — WET PREP, GENITAL
Sperm: NONE SEEN
Yeast Wet Prep HPF POC: NONE SEEN

## 2021-04-23 LAB — POC URINE PREG, ED: Preg Test, Ur: NEGATIVE

## 2021-04-23 MED ORDER — AZITHROMYCIN 250 MG PO TABS
1000.0000 mg | ORAL_TABLET | Freq: Once | ORAL | Status: AC
  Administered 2021-04-23: 1000 mg via ORAL
  Filled 2021-04-23: qty 4

## 2021-04-23 MED ORDER — METRONIDAZOLE 500 MG PO TABS
500.0000 mg | ORAL_TABLET | Freq: Once | ORAL | Status: AC
  Administered 2021-04-23: 500 mg via ORAL
  Filled 2021-04-23: qty 1

## 2021-04-23 MED ORDER — METRONIDAZOLE 500 MG PO TABS: 500.0000 mg | ORAL_TABLET | Freq: Two times a day (BID) | ORAL | 0 refills | Status: DC

## 2021-04-23 MED ORDER — STERILE WATER FOR INJECTION IJ SOLN
INTRAMUSCULAR | Status: AC
  Administered 2021-04-23: 2.1 mL
  Filled 2021-04-23: qty 10

## 2021-04-23 MED ORDER — CEFTRIAXONE SODIUM 1 G IJ SOLR
500.0000 mg | Freq: Once | INTRAMUSCULAR | Status: AC
  Administered 2021-04-23: 500 mg via INTRAMUSCULAR
  Filled 2021-04-23: qty 10

## 2021-04-23 NOTE — ED Notes (Signed)
POC Urine preg was negative

## 2021-04-23 NOTE — ED Provider Notes (Signed)
Emergency Medicine Provider Triage Evaluation Note  [name] , a 35 y.o. female  was evaluated in triage.  Pt complains of STD exposure.  Her sexual partner tested positive for chlamydia and she is concerned.  She has had vaginal itching and some pelvic cramping.    Review of Systems  Positive: Vaginal itching, pelvic cramping Negative: Dysuria, hematuria, fevers, flank pain  Physical Exam  BP 115/74 (BP Location: Right Arm)   Pulse 91   Temp 98.6 F (37 C) (Oral)   SpO2 100%  Gen:   Awake, no distress   Resp:  Normal effort  MSK:   Moves extremities without difficulty  Other:    Medical Decision Making  Medically screening exam initiated at 4:49 PM.  Appropriate orders placed.  HULDA REDDIX was informed that the remainder of the evaluation will be completed by another provider, this initial triage assessment does not replace that evaluation, and the importance of remaining in the ED until their evaluation is complete.     Theron Arista, PA-C 04/23/21 1650    Vanetta Mulders, MD 04/26/21 Paulo Fruit

## 2021-04-23 NOTE — ED Triage Notes (Signed)
Patient reports partner was positive for chlamydia. States she has mild abdominal cramping.

## 2021-04-23 NOTE — ED Provider Notes (Signed)
[facility]-EMERGENCY DEPT Provider Note   CSN: [id number] Arrival date & time: 04/23/21  1627     History Chief Complaint  Patient presents with   Exposure to STD    [name] is a 35 y.o. female.  The history is provided by the patient and medical records.  Exposure to STD [name] is a 35 y.o. female who presents to the Emergency Department complaining of exposure to STD.  She developed LLQ cramping on Wednesday.  She was called today by her ex and was notified that he was positive for chlamydia.    No fever, N/V/D, dysuria, vaginal discharge.   LMP 5/7.  Not on birth control.  Uses protection during intercourse.       History reviewed. No pertinent past medical history.  Patient Active Problem List   Diagnosis Date Noted   Preterm labor 08/14/2018   Spontaneous vaginal delivery 08/14/2018   SVD (spontaneous vaginal delivery) 08/14/2018    History reviewed. No pertinent surgical history.   OB History     Gravida  2   Para  2   Term  1   Preterm  1   AB      Living  2      SAB      IAB      Ectopic      Multiple  0   Live Births  2           No family history on file.  Social History   Tobacco Use   Smoking status: Never   Smokeless tobacco: Never  Substance Use Topics   Alcohol use: No    Home Medications Prior to Admission medications   Medication Sig Start Date End Date Taking? Authorizing Provider  metroNIDAZOLE (FLAGYL) 500 MG tablet Take 1 tablet (500 mg total) by mouth 2 (two) times daily. 04/23/21  Yes Tilden Fossa, MD  ibuprofen (ADVIL) 600 MG tablet Take 1 tablet (600 mg total) by mouth every 6 (six) hours as needed. 02/13/21   Domenick Gong, MD  tiZANidine (ZANAFLEX) 4 MG tablet Take 1 tablet (4 mg total) by mouth every 8 (eight) hours as needed for muscle spasms. 02/13/21   Domenick Gong, MD  witch hazel-glycerin (TUCKS) pad Apply 1 application topically as needed for hemorrhoids.  08/16/18   Calvert Cantor, CNM    Allergies    Patient has no known allergies.  Review of Systems   Review of Systems  All other systems reviewed and are negative.  Physical Exam Updated Vital Signs BP 121/64 (BP Location: Right Arm)   Pulse 74   Temp 98.4 F (36.9 C) (Oral)   Resp 18   SpO2 100%   Physical Exam Vitals and nursing note reviewed.  Constitutional:      Appearance: She is well-developed.  HENT:     Head: Normocephalic and atraumatic.  Cardiovascular:     Rate and Rhythm: Normal rate and regular rhythm.  Pulmonary:     Effort: Pulmonary effort is normal. No respiratory distress.  Abdominal:     Palpations: Abdomen is soft.     Tenderness: There is no abdominal tenderness. There is no guarding or rebound.  Genitourinary:    Comments: Scant vaginal discharge. No CMT Musculoskeletal:        General: No tenderness.  Skin:    General: Skin is warm and dry.  Neurological:     Mental Status: She is alert and oriented to person, place,  and time.  Psychiatric:        Behavior: Behavior normal.    ED Results / Procedures / Treatments   Labs (all labs ordered are listed, but only abnormal results are displayed) Labs Reviewed  WET PREP, GENITAL - Abnormal; Notable for the following components:      Result Value   Trich, Wet Prep PRESENT (*)    Clue Cells Wet Prep HPF POC PRESENT (*)    WBC, Wet Prep HPF POC MANY (*)    All other components within normal limits  RPR  HIV ANTIBODY (ROUTINE TESTING W REFLEX)  HCG, QUANTITATIVE, PREGNANCY  POC URINE PREG, ED  GC/CHLAMYDIA PROBE AMP ([place]) NOT AT [facility]    EKG None  Radiology No results found.  Procedures Procedures   Medications Ordered in ED Medications  cefTRIAXone (ROCEPHIN) injection 500 mg (500 mg Intramuscular Given 04/23/21 1952)  azithromycin (ZITHROMAX) tablet 1,000 mg (1,000 mg Oral Given 04/23/21 1946)  metroNIDAZOLE (FLAGYL) tablet 500 mg (500 mg Oral Given 04/23/21 1952)   sterile water (preservative free) injection (2.1 mLs  Given 04/23/21 1953)    ED Course  I have reviewed the triage vital signs and the nursing notes.  Pertinent labs & imaging results that were available during my care of the patient were reviewed by me and considered in my medical decision making (see chart for details).    MDM Rules/Calculators/A&P                         patient here for evaluation of positive exposure to chlamydia. She does have some left lower quadrant cramping. Pelvic examination is benign but is present for trichomonas. Discussed this finding with patient. Will treat empirically for chlamydia as well as gonorrhea. Discussed home care for STI with outpatient follow-up and return precautions. Presentation is not consistent with PID, tuboovarian abscess  Final Clinical Impression(s) / ED Diagnoses Final diagnoses:  Trichomonal infection  Exposure to STD    Rx / DC Orders ED Discharge Orders          Ordered    metroNIDAZOLE (FLAGYL) 500 MG tablet  2 times daily        04/23/21 1940             Tilden Fossa, MD 04/23/21 2302

## 2021-04-24 LAB — RPR: RPR Ser Ql: NONREACTIVE

## 2021-04-24 LAB — GC/CHLAMYDIA PROBE AMP (~~LOC~~) NOT AT ARMC
Chlamydia: NEGATIVE
Comment: NEGATIVE
Comment: NORMAL
Neisseria Gonorrhea: NEGATIVE

## 2021-04-24 LAB — HIV ANTIBODY (ROUTINE TESTING W REFLEX): HIV Screen 4th Generation wRfx: NONREACTIVE

## 2021-04-24 LAB — HCG, QUANTITATIVE, PREGNANCY: hCG, Beta Chain, Quant, S: <1 m[IU]/mL (ref <5)

## 2024-02-04 ENCOUNTER — Encounter: Payer: Medicaid Other | Admitting: Obstetrics and Gynecology

## 2024-06-17 NOTE — Progress Notes (Signed)
 Plastic and Reconstructive Surgery  New Patient Consultation Note   Chief Complaint: No chief complaint on file.   History of Present Illness The patient presents for evaluation of a keloid. She is accompanied by her daughter.  The keloid has been present for an extended period, which she believes may be hereditary as her father also has one. It initially appeared as an itchy bump, which she irritated, leading to its transformation into a keloid. The keloid has been present for over a year and has not received any treatment, including injections or creams. It occasionally drains. She reports no similar issues in her armpits or buttocks. Her primary concern is the removal of the keloid.  She reports no other medical conditions and has never undergone surgery. She does not use nicotine and has not been hospitalized recently. She is not currently on any medications.  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  FAMILY HISTORY - Father: Keloid on ear      Past Medical History:  Medical History[1]  Past Surgical History: Surgical History[2]  Social History: Social History   Tobacco Use  . Smoking status: Never    Passive exposure: Never  . Smokeless tobacco: Never  Substance Use Topics  . Alcohol use: Not on file    Family History: family history is not on file.  Allergies: Allergies[3]  Medications: has a current medication list which includes the following prescription(s): ergocalciferol and lo loestrin fe.  Review of Systems: A complete ROS was performed with pertinent positives/negatives noted in the HPI. The remainder of the ROS are negative.  Physical Examination:  height is 1.676 m (5' 6) and weight is 71.2 kg (157 lb). Her skin temperature is 99.1 F (37.3 C). Her blood pressure is 102/66 and her pulse is 85.  Body mass index is 25.34 kg/m.  Gen: well nourished female appears stated age Psych: Normal  behavior and affect HEENT: Normocephalic.  Resp: normal respiratory effort on room air  Extremities: no clubbing or cyanosis  Skin: There are multiple keloids in the mons area, 3 x 3.5, 2 x 2, 2 x 2; these appear to be related to chronic follicular irritation/ingrown hairs - there is no obvious pre-existing scar or surgical scar. These are pruritic and tender to palpation in a sensitive area.      Assessment and Plan:  38 y.o. female with multiple keloids in the pubic area. These cause her significant distress, pain, itching, and difficulty donning clothing.   I discussed her diagnosis including natural history, relevant anatomy, and treatment options.   We discussed the most common treatment options for keloids including transdermal and injectable steroids, 5-FU, excision, and radiation therapy. We discussed each in detail. This keloid is naive to treatment at this point and we discussed a first injection today. We discussed the risks and benefits of injection with corticosteroid including dyspigmentation which may be permanent and dermal thinning/atrophy. We discussed that total regression of keloid is unlikely even with serial  injections and that further progression of the keloid is possible.   I discussed separately the option of excision with simultaneous steroid injection as an option. Recurrence following excision in combination with steroid injection decreases risk of recurrence to between 15-50% in some studies, though recurrence rate is likely dependent on keloid location, compliance with post-operative restrictions, and the individual patient's predisposition. I think it would be reasonable to attempt excision, either with local or general anesthesia. She prefers this  option and understands that postoperative serial injections are often required to decrease chance of recurrence.  She would prefer this be done in the OR.  I specifically discussed risks of excision with injection of  corticosteroid, including recurrence or progression of keloids, deformity, pain, damage to surrounding neurovascular structures, infection, pain, need for revision surgery or additional therapies, failure to achieve desired result, and cosmetic deformity. Both patient and Mom accept these risks, understanding that recurrence remains quite common.  I offered additionally a referral to radiation oncology to discuss brachitherapy or other radiotherapy which is proven to reduce recurrence. She would prefer this option be reserved to treatment failure and recurrence, should it occur.   Risks and benefits of keloid excision were discussed including bleeding, pain, scarring, infection, damage of surrounding structures, keloid recurrence, and failure to achieve desired cosmetic result or improve symptoms.   She is interested in excision and will discuss timing scheduling today.  Consent and Photos obtained.           [1] No past medical history on file. [2] No past surgical history on file. [3] No Known Allergies

## 2024-06-19 ENCOUNTER — Other Ambulatory Visit: Payer: Self-pay

## 2024-06-19 ENCOUNTER — Encounter (HOSPITAL_BASED_OUTPATIENT_CLINIC_OR_DEPARTMENT_OTHER): Payer: Self-pay

## 2024-06-23 NOTE — H&P (Signed)
 Plastic and Reconstructive Surgery  New Patient Consultation Note    Chief Complaint: No chief complaint on file.   History of Present Illness The patient presents for evaluation of a keloid. She is accompanied by her daughter.   The keloid has been present for an extended period, which she believes may be hereditary as her father also has one. It initially appeared as an itchy bump, which she irritated, leading to its transformation into a keloid. The keloid has been present for over a year and has not received any treatment, including injections or creams. It occasionally drains. She reports no similar issues in her armpits or buttocks. Her primary concern is the removal of the keloid.   She reports no other medical conditions and has never undergone surgery. She does not use nicotine and has not been hospitalized recently. She is not currently on any medications.   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   FAMILY HISTORY - Father: Keloid on ear       Past Medical History:  [Medical History]  [Medical History] Past Medical History No past medical history on file.   Past Surgical History: [Surgical History]  [Surgical History] Past Surgical History No past surgical history on file.   Social History: Social History         Tobacco Use   Smoking status: Never      Passive exposure: Never   Smokeless tobacco: Never  Substance Use Topics   Alcohol use: Not on file      Family History: family history is not on file.   Allergies: [Allergies]  [Allergies] No Known Allergies   Medications: has a current medication list which includes the following prescription(s): ergocalciferol and lo loestrin fe.   Review of Systems: A complete ROS was performed with pertinent positives/negatives noted in the HPI. The remainder of the ROS are negative.   Physical Examination:  height is 1.676 m (5' 6) and weight is 71.2  kg (157 lb). Her skin temperature is 99.1 F (37.3 C). Her blood pressure is 102/66 and her pulse is 85.  Body mass index is 25.34 kg/m.   Gen: well nourished female appears stated age Psych: Normal behavior and affect HEENT: Normocephalic.  Resp: normal respiratory effort on room air  Extremities: no clubbing or cyanosis  Skin: There are multiple keloids in the mons area, 3 x 3.5, 2 x 2, 2 x 2; these appear to be related to chronic follicular irritation/ingrown hairs - there is no obvious pre-existing scar or surgical scar. These are pruritic and tender to palpation in a sensitive area.        Assessment and Plan:   38 y.o. female with multiple keloids in the pubic area. These cause her significant distress, pain, itching, and difficulty donning clothing.    I discussed her diagnosis including natural history, relevant anatomy, and treatment options.    We discussed the most common treatment options for keloids including transdermal and injectable steroids, 5-FU, excision, and radiation therapy. We discussed each in detail. This keloid is naive to treatment at this point and we discussed a first injection today. We discussed the risks and benefits of injection with corticosteroid including dyspigmentation which may be permanent and dermal thinning/atrophy. We discussed that total regression of keloid is unlikely even with serial  injections and that further progression of the keloid is possible.    I discussed separately the option of excision with simultaneous steroid  injection as an option. Recurrence following excision in combination with steroid injection decreases risk of recurrence to between 15-50% in some studies, though recurrence rate is likely dependent on keloid location, compliance with post-operative restrictions, and the individual patient's predisposition. I think it would be reasonable to attempt excision, either with local or general anesthesia. She prefers this option and  understands that postoperative serial injections are often required to decrease chance of recurrence.   She would prefer this be done in the OR.   I specifically discussed risks of excision with injection of corticosteroid, including recurrence or progression of keloids, deformity, pain, damage to surrounding neurovascular structures, infection, pain, need for revision surgery or additional therapies, failure to achieve desired result, and cosmetic deformity. Both patient and Mom accept these risks, understanding that recurrence remains quite common.   I offered additionally a referral to radiation oncology to discuss brachitherapy or other radiotherapy which is proven to reduce recurrence. She would prefer this option be reserved to treatment failure and recurrence, should it occur.    Risks and benefits of keloid excision were discussed including bleeding, pain, scarring, infection, damage of surrounding structures, keloid recurrence, and failure to achieve desired cosmetic result or improve symptoms.    She is interested in excision and will discuss timing scheduling today.   Consent and Photos obtained.

## 2024-06-25 ENCOUNTER — Other Ambulatory Visit: Payer: Self-pay

## 2024-06-25 ENCOUNTER — Ambulatory Visit (HOSPITAL_BASED_OUTPATIENT_CLINIC_OR_DEPARTMENT_OTHER)

## 2024-06-25 ENCOUNTER — Encounter (HOSPITAL_BASED_OUTPATIENT_CLINIC_OR_DEPARTMENT_OTHER): Payer: Self-pay

## 2024-06-25 ENCOUNTER — Encounter (HOSPITAL_BASED_OUTPATIENT_CLINIC_OR_DEPARTMENT_OTHER): Admission: RE | Disposition: A | Discharge status: Home / Self Care | Payer: Self-pay | Source: Home / Self Care

## 2024-06-25 ENCOUNTER — Ambulatory Visit (HOSPITAL_BASED_OUTPATIENT_CLINIC_OR_DEPARTMENT_OTHER): Admission: RE | Admit: 2024-06-25 | Discharge: 2024-06-25 | Disposition: A | Discharge status: Home / Self Care

## 2024-06-25 DIAGNOSIS — L910 Hypertrophic scar: Secondary | ICD-10-CM | POA: Insufficient documentation

## 2024-06-25 DIAGNOSIS — Z01818 Encounter for other preprocedural examination: Secondary | ICD-10-CM

## 2024-06-25 HISTORY — PX: EXCISION MASS ABDOMINAL: SHX6701

## 2024-06-25 LAB — POCT PREGNANCY, URINE: Preg Test, Ur: NEGATIVE

## 2024-06-25 SURGERY — EXCISION, MASS, TORSO
Anesthesia: Monitor Anesthesia Care | Site: Abdomen

## 2024-06-25 MED ORDER — CHLORHEXIDINE GLUCONATE CLOTH 2 % EX PADS: 6.0000 | MEDICATED_PAD | Freq: Once | CUTANEOUS | Status: DC

## 2024-06-25 MED ORDER — ONDANSETRON HCL 4 MG/2ML IJ SOLN
INTRAMUSCULAR | Status: DC | PRN
  Administered 2024-06-25: 4 mg via INTRAVENOUS

## 2024-06-25 MED ORDER — PROPOFOL 10 MG/ML IV BOLUS
INTRAVENOUS | Status: DC | PRN
  Administered 2024-06-25: 50 mg via INTRAVENOUS
  Administered 2024-06-25: 20 mg via INTRAVENOUS
  Administered 2024-06-25 (×3): 50 mg via INTRAVENOUS
  Administered 2024-06-25: 30 mg via INTRAVENOUS
  Administered 2024-06-25: 20 mg via INTRAVENOUS
  Administered 2024-06-25: 50 mg via INTRAVENOUS

## 2024-06-25 MED ORDER — MIDAZOLAM HCL 5 MG/5ML IJ SOLN
INTRAMUSCULAR | Status: DC | PRN
  Administered 2024-06-25: 2 mg via INTRAVENOUS

## 2024-06-25 MED ORDER — BUPIVACAINE-EPINEPHRINE 0.25% -1:200000 IJ SOLN
INTRAMUSCULAR | Status: DC | PRN
Start: 2024-06-25 — End: 2024-06-25
  Administered 2024-06-25: 40 mL

## 2024-06-25 MED ORDER — TRIAMCINOLONE ACETONIDE 40 MG/ML IJ SUSP
INTRAMUSCULAR | Status: AC
  Filled 2024-06-25: qty 5

## 2024-06-25 MED ORDER — DEXMEDETOMIDINE HCL IN NACL 80 MCG/20ML IV SOLN: INTRAVENOUS | Status: DC | PRN

## 2024-06-25 MED ORDER — BACITRACIN 500 UNIT/GM EX OINT
TOPICAL_OINTMENT | CUTANEOUS | Status: DC | PRN
  Administered 2024-06-25: 1 via TOPICAL

## 2024-06-25 MED ORDER — POLYETHYLENE GLYCOL 3350 17 G PO PACK: 17.0000 g | PACK | Freq: Every day | ORAL | 0 refills | Status: AC

## 2024-06-25 MED ORDER — CHLORHEXIDINE GLUCONATE CLOTH 2 % EX PADS
6.0000 | MEDICATED_PAD | Freq: Once | CUTANEOUS | Status: AC
  Administered 2024-06-25: 6 via TOPICAL

## 2024-06-25 MED ORDER — LIDOCAINE 2% (20 MG/ML) 5 ML SYRINGE
INTRAMUSCULAR | Status: AC
  Filled 2024-06-25: qty 5

## 2024-06-25 MED ORDER — LACTATED RINGERS IV SOLN: INTRAVENOUS | Status: DC | PRN

## 2024-06-25 MED ORDER — ONDANSETRON HCL 4 MG PO TABS: 4.0000 mg | ORAL_TABLET | Freq: Three times a day (TID) | ORAL | 0 refills | Status: AC | PRN

## 2024-06-25 MED ORDER — PHENYLEPHRINE 80 MCG/ML (10ML) SYRINGE FOR IV PUSH (FOR BLOOD PRESSURE SUPPORT)
PREFILLED_SYRINGE | INTRAVENOUS | Status: AC
  Filled 2024-06-25: qty 10

## 2024-06-25 MED ORDER — LACTATED RINGERS IV SOLN: INTRAVENOUS | Status: DC

## 2024-06-25 MED ORDER — FENTANYL CITRATE (PF) 100 MCG/2ML IJ SOLN: 25.0000 ug | INTRAMUSCULAR | Status: DC | PRN

## 2024-06-25 MED ORDER — OXYCODONE HCL 5 MG/5ML PO SOLN: 5.0000 mg | Freq: Once | ORAL | Status: DC | PRN

## 2024-06-25 MED ORDER — BUPIVACAINE-EPINEPHRINE (PF) 0.25% -1:200000 IJ SOLN
INTRAMUSCULAR | Status: AC
  Filled 2024-06-25: qty 30

## 2024-06-25 MED ORDER — FENTANYL CITRATE (PF) 100 MCG/2ML IJ SOLN
INTRAMUSCULAR | Status: AC
  Filled 2024-06-25: qty 2

## 2024-06-25 MED ORDER — DEXAMETHASONE SODIUM PHOSPHATE 10 MG/ML IJ SOLN
INTRAMUSCULAR | Status: AC
  Filled 2024-06-25: qty 1

## 2024-06-25 MED ORDER — MIDAZOLAM HCL 2 MG/2ML IJ SOLN
INTRAMUSCULAR | Status: AC
  Filled 2024-06-25: qty 2

## 2024-06-25 MED ORDER — CEFAZOLIN SODIUM-DEXTROSE 2-4 GM/100ML-% IV SOLN
INTRAVENOUS | Status: AC
  Filled 2024-06-25: qty 100

## 2024-06-25 MED ORDER — OXYCODONE HCL 5 MG PO TABS: 5.0000 mg | ORAL_TABLET | Freq: Three times a day (TID) | ORAL | 0 refills | Status: AC | PRN

## 2024-06-25 MED ORDER — ACETAMINOPHEN 10 MG/ML IV SOLN: 1000.0000 mg | Freq: Once | INTRAVENOUS | Status: DC | PRN

## 2024-06-25 MED ORDER — PROPOFOL 500 MG/50ML IV EMUL
INTRAVENOUS | Status: AC
  Filled 2024-06-25: qty 100

## 2024-06-25 MED ORDER — PROPOFOL 500 MG/50ML IV EMUL
INTRAVENOUS | Status: DC | PRN
  Administered 2024-06-25: 125 ug/kg/min via INTRAVENOUS

## 2024-06-25 MED ORDER — CEFAZOLIN SODIUM-DEXTROSE 2-4 GM/100ML-% IV SOLN
2.0000 g | INTRAVENOUS | Status: AC
  Administered 2024-06-25: 2 g via INTRAVENOUS

## 2024-06-25 MED ORDER — TRIAMCINOLONE ACETONIDE 40 MG/ML IJ SUSP
INTRAMUSCULAR | Status: DC | PRN
  Administered 2024-06-25: 160 mg

## 2024-06-25 MED ORDER — FENTANYL CITRATE (PF) 100 MCG/2ML IJ SOLN
INTRAMUSCULAR | Status: DC | PRN
  Administered 2024-06-25 (×4): 25 ug via INTRAVENOUS

## 2024-06-25 MED ORDER — DEXAMETHASONE SODIUM PHOSPHATE 10 MG/ML IJ SOLN
INTRAMUSCULAR | Status: DC | PRN
  Administered 2024-06-25: 10 mg via INTRAVENOUS

## 2024-06-25 MED ORDER — DEXMEDETOMIDINE HCL IN NACL 80 MCG/20ML IV SOLN
INTRAVENOUS | Status: DC | PRN
  Administered 2024-06-25: 8 ug via INTRAVENOUS

## 2024-06-25 MED ORDER — DEXMEDETOMIDINE HCL IN NACL 80 MCG/20ML IV SOLN
INTRAVENOUS | Status: AC
  Filled 2024-06-25: qty 20

## 2024-06-25 MED ORDER — LIDOCAINE-EPINEPHRINE 2 %-1:100000 IJ SOLN
INTRAMUSCULAR | Status: AC
  Filled 2024-06-25: qty 1

## 2024-06-25 MED ORDER — DROPERIDOL 2.5 MG/ML IJ SOLN: 0.6250 mg | Freq: Once | INTRAMUSCULAR | Status: DC | PRN

## 2024-06-25 MED ORDER — OXYCODONE HCL 5 MG PO TABS: 5.0000 mg | ORAL_TABLET | Freq: Once | ORAL | Status: DC | PRN

## 2024-06-25 MED ORDER — ONDANSETRON HCL 4 MG/2ML IJ SOLN
INTRAMUSCULAR | Status: AC
  Filled 2024-06-25: qty 2

## 2024-06-25 SURGICAL SUPPLY — 63 items
BAND RUBBER #18 3X1/16 STRL (MISCELLANEOUS) IMPLANT
BENZOIN TINCTURE PRP APPL 2/3 (GAUZE/BANDAGES/DRESSINGS) IMPLANT
BLADE CLIPPER SURG (BLADE) IMPLANT
BLADE SURG 11 STRL SS (BLADE) IMPLANT
BLADE SURG 15 STRL LF DISP TIS (BLADE) ×1 IMPLANT
CANISTER SUCT 1200ML W/VALVE (MISCELLANEOUS) IMPLANT
CHLORAPREP W/TINT 26 (MISCELLANEOUS) IMPLANT
COVER BACK TABLE 60X90IN (DRAPES) ×1 IMPLANT
COVER MAYO STAND STRL (DRAPES) ×1 IMPLANT
DERMABOND ADVANCED .7 DNX12 (GAUZE/BANDAGES/DRESSINGS) IMPLANT
DRAIN WOUND RND W/TROCAR (DRAIN) IMPLANT
DRAPE LAPAROTOMY 100X72 PEDS (DRAPES) IMPLANT
DRAPE U-SHAPE 76X120 STRL (DRAPES) IMPLANT
DRAPE UTILITY XL STRL (DRAPES) IMPLANT
DRSG TELFA 3X8 NADH STRL (GAUZE/BANDAGES/DRESSINGS) IMPLANT
ELECT COATED BLADE 2.86 ST (ELECTRODE) IMPLANT
ELECT NDL BLADE 2-5/6 (NEEDLE) ×1 IMPLANT
ELECT NEEDLE BLADE 2-5/6 (NEEDLE) IMPLANT
ELECTRODE REM PT RETRN 9FT PED (ELECTROSURGICAL) IMPLANT
ELECTRODE REM PT RTRN 9FT ADLT (ELECTROSURGICAL) IMPLANT
EVACUATOR SILICONE 100CC (DRAIN) IMPLANT
GAUZE SPONGE 2X2 STRL 8-PLY (GAUZE/BANDAGES/DRESSINGS) IMPLANT
GAUZE SPONGE 4X4 12PLY STRL LF (GAUZE/BANDAGES/DRESSINGS) IMPLANT
GAUZE XEROFORM 1X8 LF (GAUZE/BANDAGES/DRESSINGS) IMPLANT
GLOVE BIO SURGEON STRL SZ7.5 (GLOVE) ×1 IMPLANT
GLOVE BIOGEL PI IND STRL 6.5 (GLOVE) IMPLANT
GLOVE BIOGEL PI IND STRL 7.0 (GLOVE) IMPLANT
GLOVE BIOGEL PI IND STRL 8 (GLOVE) ×1 IMPLANT
GLOVE SURG SS PI 6.5 STRL IVOR (GLOVE) IMPLANT
GOWN STRL REUS W/ TWL LRG LVL3 (GOWN DISPOSABLE) ×1 IMPLANT
GOWN STRL REUS W/ TWL XL LVL3 (GOWN DISPOSABLE) ×1 IMPLANT
NDL HYPO 25GX1X1/2 BEV (NEEDLE) IMPLANT
NDL HYPO 30GX1 BEV (NEEDLE) IMPLANT
NDL PRECISIONGLIDE 27X1.5 (NEEDLE) ×1 IMPLANT
NDL SAFETY ECLIPSE 18X1.5 (NEEDLE) IMPLANT
NEEDLE HYPO 25GX1X1/2 BEV (NEEDLE) ×1 IMPLANT
NEEDLE HYPO 30GX1 BEV (NEEDLE) IMPLANT
NEEDLE PRECISIONGLIDE 27X1.5 (NEEDLE) IMPLANT
NS IRRIG 1000ML POUR BTL (IV SOLUTION) IMPLANT
PACK BASIN DAY SURGERY FS (CUSTOM PROCEDURE TRAY) ×1 IMPLANT
PENCIL SMOKE EVACUATOR (MISCELLANEOUS) ×1 IMPLANT
SHEET MEDIUM DRAPE 40X70 STRL (DRAPES) IMPLANT
SLEEVE SCD COMPRESS KNEE MED (STOCKING) IMPLANT
SPONGE T-LAP 18X18 ~~LOC~~+RFID (SPONGE) IMPLANT
STAPLER SKIN PROX WIDE 3.9 (STAPLE) IMPLANT
STRIP CLOSURE SKIN 1/2X4 (GAUZE/BANDAGES/DRESSINGS) IMPLANT
SUCTION TUBE FRAZIER 10FR DISP (SUCTIONS) IMPLANT
SUT ETHILON 4 0 PS 2 18 (SUTURE) IMPLANT
SUT MNCRL AB 3-0 PS2 18 (SUTURE) IMPLANT
SUT MNCRL AB 4-0 PS2 18 (SUTURE) IMPLANT
SUT MON AB 5-0 P3 18 (SUTURE) IMPLANT
SUT PROLENE 5 0 P 3 (SUTURE) IMPLANT
SUT PROLENE 6 0 P 1 18 (SUTURE) IMPLANT
SUT VIC AB 4-0 PS2 18 (SUTURE) IMPLANT
SUT VICRYL RAPIDE 4-0 (SUTURE) IMPLANT
SWAB COLLECTION DEVICE MRSA (MISCELLANEOUS) IMPLANT
SWAB CULTURE ESWAB REG 1ML (MISCELLANEOUS) IMPLANT
SYR 5ML LUER SLIP (SYRINGE) IMPLANT
SYR BULB EAR ULCER 3OZ GRN STR (SYRINGE) IMPLANT
SYR CONTROL 10ML LL (SYRINGE) ×1 IMPLANT
TOWEL GREEN STERILE FF (TOWEL DISPOSABLE) ×1 IMPLANT
TRAY DSU PREP LF (CUSTOM PROCEDURE TRAY) IMPLANT
TUBE CONNECTING 20X1/4 (TUBING) IMPLANT

## 2024-06-25 NOTE — Anesthesia Postprocedure Evaluation (Signed)
 Anesthesia Post Note  Patient: [name]  Procedure(s) Performed: EXCISION OF KELOID SCARS OF MONS REGION (Abdomen)     Patient location during evaluation: PACU Anesthesia Type: MAC Level of consciousness: awake and alert Pain management: pain level controlled Vital Signs Assessment: post-procedure vital signs reviewed and stable Respiratory status: spontaneous breathing, nonlabored ventilation, respiratory function stable and patient connected to nasal cannula oxygen Cardiovascular status: stable and blood pressure returned to baseline Postop Assessment: no apparent nausea or vomiting Anesthetic complications: no   No notable events documented.  Last Vitals:  Vitals:   06/25/24 1418 06/25/24 1435  BP: (!) 98/59 124/73  Pulse: 76 70  Resp: 17 18  Temp:  (!) 36.2 C  SpO2: 98% 100%    Last Pain:  Vitals:   06/25/24 1435  TempSrc: Temporal  PainSc:                  Franky JONETTA Bald

## 2024-06-25 NOTE — Anesthesia Procedure Notes (Signed)
 Procedure Name: MAC Date/Time: 06/25/2024 12:56 PM  Performed by: Denton Niels CROME, CRNAPre-anesthesia Checklist: Patient identified, Emergency Drugs available, Suction available, Patient being monitored and Timeout performed Oxygen Delivery Method: Simple face mask Induction Type: IV induction Airway Equipment and Method: Oral airway Placement Confirmation: positive ETCO2 Dental Injury: Teeth and Oropharynx as per pre-operative assessment

## 2024-06-25 NOTE — Transfer of Care (Signed)
 Immediate Anesthesia Transfer of Care Note  Patient: [name]  Procedure(s) Performed: EXCISION OF KELOID SCARS OF MONS REGION (Abdomen)  Patient Location: PACU  Anesthesia Type:MAC  Level of Consciousness: drowsy, patient cooperative, and responds to stimulation  Airway & Oxygen Therapy: Patient Spontanous Breathing and Patient connected to face mask oxygen  Post-op Assessment: Report given to RN and Post -op Vital signs reviewed and stable  Post vital signs: Reviewed and stable  Last Vitals:  Vitals Value Taken Time  BP 93/54 06/25/24 14:00  Temp 36.2 C 06/25/24 13:54  Pulse 81 06/25/24 14:01  Resp 15 06/25/24 14:01  SpO2 100 % 06/25/24 14:01  Vitals shown include unfiled device data.  Last Pain:  Vitals:   06/25/24 1354  TempSrc:   PainSc: Asleep         Complications: No notable events documented.

## 2024-06-25 NOTE — Interval H&P Note (Signed)
 History and Physical Interval Note:  06/25/2024 11:46 AM  [name]  has presented today for surgery, with the diagnosis of Keloid of mons area.  The various methods of treatment have been discussed with the patient and family. After consideration of risks, benefits and other options for treatment, the patient has consented to  Procedure(s): EXCISION, MASS, TORSO (N/A) as a surgical intervention.  The patient's history has been reviewed, patient examined, no change in status, stable for surgery.  I have reviewed the patient's chart and labs.  Questions were answered to the patient's satisfaction.    I reviewed risks again with the patient, specifically risks of recurrent, pain, scarring, deformity, failure to achieve desired result, infection, bleeding, numbness, need for future treatment or revisionary surgery. She expressed understanding and elects to proceed as planned.    [name]

## 2024-06-25 NOTE — Op Note (Signed)
 Operative Note   DATE OF OPERATION: 06/25/24   LOCATION: [facility]  SURGICAL DIVISION: Plastic Surgery  PREOPERATIVE DIAGNOSES:  Keloid scarring of mons region   POSTOPERATIVE DIAGNOSES:  same  PROCEDURE:  Excision of keloid scars of mons region x 2.  Kenalog  injection of keloid scars x 2    SURGEON: Lang Shadow MD MBA  ASSISTANT: None  ANESTHESIA:  General.   EBL: 1- cc   COMPLICATIONS: None immediate.   INDICATIONS FOR PROCEDURE:  The patient, [name], is a 38 y.o. female born on 1985/12/23, is here for excision of mons keloids. Risks, benefits, indications, and alternatives were reviewed pre-operatively.   FINDINGS: Cluster of keloid scars located at superior aspect of hair-bearing mons pubis.   DESCRIPTION OF PROCEDURE:  The patient's operative site was marked with the patient in the preoperative area. The patient was taken to the operating room. SCDs were placed and IV antibiotics were given. The patient's operative site was prepped and draped in a sterile fashion. A time out was performed and all information was confirmed to be correct.  Ellipses were designed transversely containing two separate keloids. Superior scar was excised as a horizontal ellipse measuring 3.5 cm. The more inferior cluster of scars were excised as an ellipse measuring 11.5 cm. Scalpel and cautery were used to remove the scars at subcutaneous depth in their entirety. Hemostasis was ensured.   Closure was performed in layers with 3-0 monocryl as follows: interrupted sutures in the superficial fascial layer, interrupted dermal sutures, running subcuticular for the skin.  Kenalog  40 mg/ml was injected into the dermis of adjacent skin where excision was performed. A total of 4 cc were injected between the two sites.   Skin was cleansed and dry dressing applied.   The patient was allowed to wake from anesthesia, extubated and taken to the recovery room in satisfactory condition.   SPECIMENS:  none  DRAINS: none   Office/ physician access line after hours [phone]

## 2024-06-25 NOTE — Discharge Instructions (Addendum)
 Surgery Discharge Instructions:  1. Call your doctor or go to the emergency room if you have:  - Fever of 101 degrees or higher - Uncontrollable pain or pain that has increased greatly since your surgery - Increased redness around your incision (cut); incision pulling apart; thick, white drainage (pus), bleeding, or a large amount of fluid from your incision. - Nausea and vomiting that continue despite medications - Urine output less than is normal for you  - Chest pain/shortness of breath  2. Wound Care/Dressings/Drain Instructions:   - OK to shower in 24 hours - Keep dry dressing in place until follow up appointment  - Avoid heavy lifting, strenuous activity, or movements that pull on the incision (bending backward, arching your back, extending the hips). Standing 'hunched over' while you heal will prevent the wound from healing under tension and prevent complications with healing.  - pain medicine, nausea medicine, and stool softener has been sent to your pharmacy.   3. Follow Up:  - A follow up appointment should be scheduled for you for approximately one week after discharge, and our secretaries will be contacting you with the details of that appointment. However, If you do not hear about your appoinment in 3 business days, please call the provided surgery clinic number and confirm/schedule your appointment.    [email or web address] @   4. Activity/Restrictions:  - Resume your regular activities, as tolerated  5. Diet: -  Resume your regular diet, as tolerated  6. Additional Instructions: - Please take an over the counter stool softener while taking narcotic pain medication - DO NOT MIX NARCOTIC PAIN MEDICATIONS OR TAKE NARCOTIC PRESCRIPTIONS AT THE SAME TIME (PERCODET, LORTAB, ROXICODONE , ETC.) - DO NOT DRIVE OR OPERATE HEAVY MACHINERY WHILE ON NARCOTICS  - DO NOT TAKE MORE THAN 4 GRAMS (4000mg ) OF TYLENOL  (ACETAMINOPHEN ) IN 24  HOURS ________________________________________________________________ Department of Plastic Surgery Contact Info: Plastic Surgery Office Contact for daytime hours - [phone] Office/ physician access line after hours [phone]    Post Anesthesia Home Care Instructions  Activity: Get plenty of rest for the remainder of the day. A responsible individual must stay with you for 24 hours following the procedure.  For the next 24 hours, DO NOT: -Drive a car -Advertising copywriter -Drink alcoholic beverages -Take any medication unless instructed by your physician -Make any legal decisions or sign important papers.  Meals: Start with liquid foods such as gelatin or soup. Progress to regular foods as tolerated. Avoid greasy, spicy, heavy foods. If nausea and/or vomiting occur, drink only clear liquids until the nausea and/or vomiting subsides. Call your physician if vomiting continues.  Special Instructions/Symptoms: Your throat may feel dry or sore from the anesthesia or the breathing tube placed in your throat during surgery. If this causes discomfort, gargle with warm salt water . The discomfort should disappear within 24 hours.

## 2024-06-25 NOTE — Anesthesia Preprocedure Evaluation (Addendum)
 Anesthesia Evaluation  Patient identified by MRN, date of birth, ID band Patient awake    Reviewed: Allergy & Precautions, H&P , NPO status , Patient's Chart, lab work & pertinent test results  Airway Mallampati: I  TM Distance: >3 FB Neck ROM: Full    Dental  (+) Teeth Intact, Dental Advisory Given   Pulmonary neg pulmonary ROS   breath sounds clear to auscultation       Cardiovascular negative cardio ROS  Rhythm:Regular Rate:Normal     Neuro/Psych negative neurological ROS  negative psych ROS   GI/Hepatic negative GI ROS, Neg liver ROS,,,  Endo/Other  negative endocrine ROS    Renal/GU negative Renal ROS  negative genitourinary   Musculoskeletal negative musculoskeletal ROS (+)    Abdominal   Peds negative pediatric ROS (+)  Hematology negative hematology ROS (+)   Anesthesia Other Findings Keloid of mons area  Reproductive/Obstetrics negative OB ROS                              Anesthesia Physical Anesthesia Plan  ASA: 1  Anesthesia Plan: MAC   Post-op Pain Management: Tylenol  PO (pre-op)* and Minimal or no pain anticipated   Induction: Intravenous  PONV Risk Score and Plan: 2 and Propofol  infusion, Treatment may vary due to age or medical condition and TIVA  Airway Management Planned: Natural Airway and Simple Face Mask  Additional Equipment:   Intra-op Plan:   Post-operative Plan:   Informed Consent: I have reviewed the patients History and Physical, chart, labs and discussed the procedure including the risks, benefits and alternatives for the proposed anesthesia with the patient or authorized representative who has indicated his/her understanding and acceptance.       Plan Discussed with: CRNA  Anesthesia Plan Comments:          Anesthesia Quick Evaluation

## 2024-06-26 ENCOUNTER — Encounter (HOSPITAL_BASED_OUTPATIENT_CLINIC_OR_DEPARTMENT_OTHER): Payer: Self-pay
